# Patient Record
Sex: Female | Born: 1965 | Race: White | Hispanic: No | Smoking: Heavy tobacco smoker
Health system: Southern US, Community
[De-identification: ages and names within clinical notes are randomized; demographics above are authoritative.]

## PROBLEM LIST (undated history)

## (undated) DIAGNOSIS — T8859XA Other complications of anesthesia, initial encounter: Secondary | ICD-10-CM

## (undated) DIAGNOSIS — K219 Gastro-esophageal reflux disease without esophagitis: Secondary | ICD-10-CM

## (undated) DIAGNOSIS — F329 Major depressive disorder, single episode, unspecified: Secondary | ICD-10-CM

## (undated) DIAGNOSIS — T4145XA Adverse effect of unspecified anesthetic, initial encounter: Secondary | ICD-10-CM

## (undated) DIAGNOSIS — F32A Depression, unspecified: Secondary | ICD-10-CM

## (undated) HISTORY — PX: TONSILLECTOMY: SUR1361

## (undated) HISTORY — PX: BUNIONECTOMY: SHX129

## (undated) HISTORY — PX: ABDOMINAL HYSTERECTOMY: SHX81

---

## 2012-07-23 ENCOUNTER — Emergency Department (HOSPITAL_COMMUNITY)
Admission: EM | Admit: 2012-07-23 | Discharge: 2012-07-23 | Disposition: A | Discharge status: Home / Self Care | Payer: Self-pay | Source: Home / Self Care

## 2012-07-23 ENCOUNTER — Encounter (HOSPITAL_COMMUNITY): Payer: Self-pay

## 2012-07-23 DIAGNOSIS — R490 Dysphonia: Secondary | ICD-10-CM

## 2012-07-23 MED ORDER — CEPASTAT 14.5 MG MT LOZG: 1.0000 | LOZENGE | OROMUCOSAL | Status: DC | PRN

## 2012-07-23 MED ORDER — CETIRIZINE HCL 10 MG PO CAPS: 1.0000 | ORAL_CAPSULE | Freq: Every day | ORAL | Status: DC

## 2012-07-23 NOTE — ED Notes (Signed)
Patient states she was sick with cold like symptoms a while back and  Now has had a hoarse voice for couple of months Denies fever

## 2012-07-23 NOTE — ED Notes (Signed)
Patient Demographics  Rachel Whitaker, is a 47 y.o. female  ZOX:[id number]  WJX:[id number]  DOB - [birth year]  Chief Complaint  Patient presents with  . Hoarse        Subjective:   Rachel Whitaker who is a pack-a-day smoker presents with 2 month history of hoarse voice after she developed a URI 2 months ago, denies any dysphagia or odynophagia, no pain or discomfort in the throat, no swollen glands or discharge. Denies any weight loss . No personal or family history of laryngeal cancer.  Objective:   History reviewed. No pertinent past medical history.    Past Surgical History  Procedure Laterality Date  . Abdominal hysterectomy    . Tonsillectomy       Filed Vitals:   07/23/12 1312  BP: 122/78  Pulse: 82  Temp: 98 F (36.7 C)  TempSrc: Oral  SpO2: 98%     Exam  Awake Alert, Oriented X 3, No new F.N deficits, Normal affect Mason.AT,PERRAL Supple Neck,No JVD, No cervical lymphadenopathy appriciated.  Symmetrical Chest wall movement, Good air movement bilaterally, CTAB RRR,No Gallops,Rubs or new Murmurs, No Parasternal Heave +ve B.Sounds, Abd Soft, Non tender, No organomegaly appriciated, No rebound - guarding or rigidity. No Cyanosis, Clubbing or edema, No new Rash or bruise       Data Review   CBC No results found for this basename: WBC, HGB, HCT, PLT, MCV, MCH, MCHC, RDW, NEUTRABS, LYMPHSABS, MONOABS, EOSABS, BASOSABS, BANDABS, BANDSABD,  in the last 168 hours  Chemistries   No results found for this basename: NA, K, CL, CO2, GLUCOSE, BUN, CREATININE, GFRCGP, CALCIUM, MG, AST, ALT, ALKPHOS, BILITOT,  in the last 168 hours ------------------------------------------------------------------------------------------------------------------ No results found for this basename: HGBA1C,  in the last 72 hours ------------------------------------------------------------------------------------------------------------------ No results found for this basename: CHOL, HDL,  LDLCALC, TRIG, CHOLHDL, LDLDIRECT,  in the last 72 hours ------------------------------------------------------------------------------------------------------------------ No results found for this basename: TSH, T4TOTAL, FREET3, T3FREE, THYROIDAB,  in the last 72 hours ------------------------------------------------------------------------------------------------------------------ No results found for this basename: VITAMINB12, FOLATE, FERRITIN, TIBC, IRON, RETICCTPCT,  in the last 72 hours  Coagulation profile  No results found for this basename: INR, PROTIME,  in the last 168 hours     Prior to Admission medications   Medication Sig Start Date End Date Taking? Authorizing Provider  Cetirizine HCl (ZYRTEC ALLERGY) 10 MG CAPS Take 1 capsule (10 mg total) by mouth at bedtime. 07/23/12   Leroy Sea, MD  phenol-menthol (CEPASTAT) 14.5 MG lozenge Take 1 lozenge by mouth every 4 (four) hours as needed for pain. 07/23/12   Leroy Sea, MD     Assessment & Plan   Hoarse voice when on for 2 months and active smoker. Will give her a trial of Zyrtec along with Cepacol, however it is important for her to get a laryngeal exam by ENT. Referral has been made. She is been advised to quit smoking.    Follow-up Information   Follow up with Melvenia Beam, MD. Schedule an appointment as soon as possible for a visit in 1 week. (Please request secondary to make a referral for hoarse voice)    Contact information:   [address] [place] [address] [phone]        Leroy Sea M.D on 07/23/2012 at 1:19 PM   Leroy Sea, MD 07/23/12 1321

## 2012-07-26 NOTE — ED Notes (Signed)
Patient has an appointment 07/26/12 with dr shoemaker @ 9:30 am

## 2012-10-04 ENCOUNTER — Telehealth: Payer: Self-pay | Admitting: Family Medicine

## 2012-10-04 NOTE — Telephone Encounter (Signed)
Pt came in April and was referred to Ear, throat, mouth specialist by Dr Thedore Mins.  Pt did not received orange card until this month and is calling to see status of referral.  Pt made appt for 6/18 but is wondering if referral made by Dr. Thedore Mins is still valid or she needs a new one.

## 2012-10-06 ENCOUNTER — Ambulatory Visit: Payer: No Typology Code available for payment source | Attending: Family Medicine | Admitting: Internal Medicine

## 2012-10-06 VITALS — BP 145/78 | HR 72 | Temp 98.4°F | Resp 16 | Wt 135.8 lb

## 2012-10-06 DIAGNOSIS — J382 Nodules of vocal cords: Secondary | ICD-10-CM

## 2012-10-06 NOTE — Progress Notes (Unsigned)
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

## 2012-10-06 NOTE — Progress Notes (Unsigned)
Patient ID: Rachel Whitaker, female   DOB: 1966/01/25, 47 y.o.   MRN: [id number]   Another referral for ENT has been provided, Patient has not been billed for this office visit

## 2012-10-07 NOTE — Telephone Encounter (Signed)
I spoke to pt yesterday and we are going to sent a new referral to ent .

## 2012-10-13 ENCOUNTER — Encounter (HOSPITAL_COMMUNITY): Payer: Self-pay | Admitting: Pharmacy Technician

## 2012-10-13 NOTE — Pre-Procedure Instructions (Signed)
Rachel Whitaker  10/13/2012   Your procedure is scheduled on: Friday, October 15, 2012  Report to [facility] at  8:30 AM.      Main Entrance to [facility] to 3rd Floor, Short Stay.   Call this number if you have problems the morning of surgery: [phone]    Remember:   Do not eat food or drink liquids after midnight.   Take these medicines the morning of surgery with A SIP OF WATER: omeprazole (PRILOSEC) 40 MG capsule Stop taking Aspirin and herbal medications. Do not take any NSAIDs ie: Ibuprofen, Advil, Naproxen or any medication containing Aspirin.   Do not wear jewelry, make-up or nail polish.  Do not wear lotions, powders, or perfumes. You may wear deodorant.  Do not shave 48 hours prior to surgery.   Do not bring valuables to the hospital.  [facility] is not responsible for any belongings or valuables.  Contacts, dentures or bridgework may not be worn into surgery.  Leave suitcase in the car. After surgery it may be brought to your room.  For patients admitted to the hospital, checkout time is 11:00 AM the day of discharge.   Patients discharged the day of surgery will not be allowed to drive home.  Name and phone number of your driver:-  Special Instructions: Shower with CHG wash ( Bactoshield) tonight and again in the am prior to arriving to hospital.   Please read over the following fact sheets that you were given: Pain Booklet, Coughing and Deep Breathing and Surgical Site Infection Prevention

## 2012-10-14 ENCOUNTER — Encounter (HOSPITAL_COMMUNITY)
Admission: RE | Admit: 2012-10-14 | Discharge: 2012-10-14 | Disposition: A | Discharge status: Home / Self Care | Payer: No Typology Code available for payment source | Source: Ambulatory Visit | Attending: Otolaryngology | Admitting: Otolaryngology

## 2012-10-14 ENCOUNTER — Encounter (HOSPITAL_COMMUNITY): Payer: Self-pay

## 2012-10-14 ENCOUNTER — Other Ambulatory Visit: Payer: Self-pay | Admitting: Otolaryngology

## 2012-10-14 DIAGNOSIS — Z01812 Encounter for preprocedural laboratory examination: Secondary | ICD-10-CM | POA: Insufficient documentation

## 2012-10-14 DIAGNOSIS — Z01818 Encounter for other preprocedural examination: Secondary | ICD-10-CM | POA: Insufficient documentation

## 2012-10-14 HISTORY — DX: Depression, unspecified: F32.A

## 2012-10-14 HISTORY — DX: Major depressive disorder, single episode, unspecified: F32.9

## 2012-10-14 HISTORY — DX: Adverse effect of unspecified anesthetic, initial encounter: T41.45XA

## 2012-10-14 HISTORY — DX: Gastro-esophageal reflux disease without esophagitis: K21.9

## 2012-10-14 HISTORY — DX: Other complications of anesthesia, initial encounter: T88.59XA

## 2012-10-14 LAB — CBC
HCT: 43.3 % (ref 36.0–46.0)
MCV: 95.2 fL (ref 78.0–100.0)
Platelets: 416 10*3/uL — ABNORMAL HIGH (ref 150–400)
RBC: 4.55 MIL/uL (ref 3.87–5.11)
WBC: 7.3 10*3/uL (ref 4.0–10.5)

## 2012-10-14 MED ORDER — CEFAZOLIN SODIUM-DEXTROSE 2-3 GM-% IV SOLR
2.0000 g | INTRAVENOUS | Status: DC
  Filled 2012-10-14 (×2): qty 50

## 2012-10-14 NOTE — Pre-Procedure Instructions (Signed)
Rachel Whitaker  10/14/2012   Your procedure is scheduled on:  June 27th.  Report to [facility] at 8;30 AM. Enter Main Entrance to 3rd Floor, Short Stay.  Call this number if you have problems the morning of surgery: [phone]   Remember:   Do not eat food or drink liquids after midnight.   Take these medicines the morning of surgery with A SIP OF WATER: Protonix.   Do not wear jewelry, make-up or nail polish.  Do not wear lotions, powders, or perfumes. You may wear deodorant.  Do not shave 48 hours prior to surgery.  Do not bring valuables to the hospital.  [facility] is not responsible for any belongings or valuables.  Contacts, dentures or bridgework may not be worn into surgery.  Leave suitcase in the car. After surgery it may be brought to your room.  For patients admitted to the hospital, checkout time is 11:00 AM the day of  discharge.   Patients discharged the day of surgery will not be allowed to drive  home.  Name and phone number of your driver: --  Special Instructions: Shower with CHG wash (Bactoshield) tonight and again in the am prior to arriving to hospital.   Please read over the following fact sheets that you were given: Pain Booklet, Coughing and Deep Breathing and Surgical Site Infection Prevention

## 2012-10-14 NOTE — Pre-Procedure Instructions (Addendum)
Felesia Stahlecker  10/14/2012   Your procedure is scheduled on:  JUne 27th.  Report to [facility] at 8:30AM.  Call this number if you have problems the morning of surgery: [phone]   Remember:   Do not eat food or drink liquids after midnight.   Take these medicines the morning of surgery with A SIP OF WATER: omeprazole (PRILOSEC)    Do not wear jewelry, make-up or nail polish.  Do not wear lotions, powders, or perfumes. You may wear deodorant.  Do not shave 48 hours prior to surgery.   Do not bring valuables to the hospital.  [facility] is not responsible for any belongings or valuables.  Contacts, dentures or bridgework may not be worn into surgery.  Leave suitcase in the car. After surgery it may be brought to your room.  For patients admitted to the hospital, checkout time is 11:00 AM the day of discharge.   Patients discharged the day of surgery will not be allowed to drive home.  Name and phone number of your driver: -   Special Instructions: Shower with CHG wash (Bactoshield) tonight and again in the am prior to arriving to hospital.   Please read over the following fact sheets that you were given: Pain Booklet, Coughing and Deep Breathing and Surgical Site Infection Prevention

## 2012-10-15 ENCOUNTER — Ambulatory Visit (HOSPITAL_COMMUNITY)
Admission: RE | Admit: 2012-10-15 | Discharge: 2012-10-15 | Disposition: A | Discharge status: Home / Self Care | Payer: No Typology Code available for payment source | Source: Ambulatory Visit | Attending: Otolaryngology | Admitting: Otolaryngology

## 2012-10-15 ENCOUNTER — Encounter (HOSPITAL_COMMUNITY): Payer: Self-pay | Admitting: Anesthesiology

## 2012-10-15 ENCOUNTER — Other Ambulatory Visit: Payer: Self-pay | Admitting: Otolaryngology

## 2012-10-15 ENCOUNTER — Encounter (HOSPITAL_COMMUNITY)
Admission: RE | Disposition: A | Discharge status: Home / Self Care | Payer: Self-pay | Source: Ambulatory Visit | Attending: Otolaryngology

## 2012-10-15 ENCOUNTER — Encounter (HOSPITAL_COMMUNITY): Payer: Self-pay | Admitting: *Deleted

## 2012-10-15 DIAGNOSIS — J381 Polyp of vocal cord and larynx: Secondary | ICD-10-CM | POA: Insufficient documentation

## 2012-10-15 DIAGNOSIS — F329 Major depressive disorder, single episode, unspecified: Secondary | ICD-10-CM | POA: Insufficient documentation

## 2012-10-15 DIAGNOSIS — F17200 Nicotine dependence, unspecified, uncomplicated: Secondary | ICD-10-CM | POA: Insufficient documentation

## 2012-10-15 DIAGNOSIS — F3289 Other specified depressive episodes: Secondary | ICD-10-CM | POA: Insufficient documentation

## 2012-10-15 DIAGNOSIS — Z79899 Other long term (current) drug therapy: Secondary | ICD-10-CM | POA: Insufficient documentation

## 2012-10-15 DIAGNOSIS — K219 Gastro-esophageal reflux disease without esophagitis: Secondary | ICD-10-CM | POA: Insufficient documentation

## 2012-10-15 DIAGNOSIS — Z538 Procedure and treatment not carried out for other reasons: Secondary | ICD-10-CM | POA: Insufficient documentation

## 2012-10-15 SURGERY — CANCELLED PROCEDURE
Laterality: Left

## 2012-10-15 MED ORDER — OXYCODONE HCL 5 MG/5ML PO SOLN: 5.0000 mg | Freq: Once | ORAL | Status: DC | PRN

## 2012-10-15 MED ORDER — MORPHINE SULFATE 2 MG/ML IJ SOLN: 1.0000 mg | INTRAMUSCULAR | Status: DC | PRN

## 2012-10-15 MED ORDER — LACTATED RINGERS IV SOLN
INTRAVENOUS | Status: DC
  Administered 2012-10-15: 10:00:00 via INTRAVENOUS

## 2012-10-15 MED ORDER — EPINEPHRINE HCL (NASAL) 0.1 % NA SOLN
NASAL | Status: AC
  Filled 2012-10-15: qty 30

## 2012-10-15 MED ORDER — OXYCODONE HCL 5 MG PO TABS: 5.0000 mg | ORAL_TABLET | Freq: Once | ORAL | Status: DC | PRN

## 2012-10-15 MED ORDER — METOCLOPRAMIDE HCL 5 MG/ML IJ SOLN: 10.0000 mg | Freq: Once | INTRAMUSCULAR | Status: DC | PRN

## 2012-10-15 SURGICAL SUPPLY — 25 items
BALLN PULM 12 13.5 15X75 (BALLOONS)
BALLN PULMONARY 10-12 (MISCELLANEOUS) IMPLANT
BALLOON PULM 12 13.5 15X75 (BALLOONS) IMPLANT
CANISTER SUCTION 2500CC (MISCELLANEOUS) ×2 IMPLANT
CLOTH BEACON ORANGE TIMEOUT ST (SAFETY) ×2 IMPLANT
CONT SPEC 4OZ CLIKSEAL STRL BL (MISCELLANEOUS) IMPLANT
COVER TABLE BACK 60X90 (DRAPES) ×2 IMPLANT
CRADLE DONUT ADULT HEAD (MISCELLANEOUS) IMPLANT
DRAPE PROXIMA HALF (DRAPES) ×2 IMPLANT
GLOVE BIO SURGEON STRL SZ7.5 (GLOVE) ×2 IMPLANT
GOWN STRL NON-REIN LRG LVL3 (GOWN DISPOSABLE) IMPLANT
KIT BASIN OR (CUSTOM PROCEDURE TRAY) ×2 IMPLANT
KIT ROOM TURNOVER OR (KITS) ×2 IMPLANT
MARKER SKIN DUAL TIP RULER LAB (MISCELLANEOUS) IMPLANT
NS IRRIG 1000ML POUR BTL (IV SOLUTION) ×2 IMPLANT
PAD ARMBOARD 7.5X6 YLW CONV (MISCELLANEOUS) ×4 IMPLANT
PAD EYE OVAL STERILE LF (GAUZE/BANDAGES/DRESSINGS) IMPLANT
PATTIES SURGICAL .5 X3 (DISPOSABLE) ×2 IMPLANT
SOLUTION ANTI FOG 6CC (MISCELLANEOUS) IMPLANT
SPONGE GAUZE 4X4 12PLY (GAUZE/BANDAGES/DRESSINGS) ×2 IMPLANT
SURGILUBE 2OZ TUBE FLIPTOP (MISCELLANEOUS) IMPLANT
SUT SILK 2 0 FS (SUTURE) IMPLANT
TOWEL OR 17X24 6PK STRL BLUE (TOWEL DISPOSABLE) ×4 IMPLANT
TUBE CONNECTING 12X1/4 (SUCTIONS) ×2 IMPLANT
WATER STERILE IRR 1000ML POUR (IV SOLUTION) ×2 IMPLANT

## 2012-10-15 NOTE — Anesthesia Preprocedure Evaluation (Deleted)
Anesthesia Evaluation  Patient identified by MRN, date of birth, ID band Patient awake    Reviewed: Allergy & Precautions, H&P , NPO status , Patient's Chart, lab work & pertinent test results, reviewed documented beta blocker date and time   History of Anesthesia Complications (+) Emergence Delirium  Airway Mallampati: II TM Distance: >3 FB Neck ROM: full    Dental   Pulmonary neg pulmonary ROS,  breath sounds clear to auscultation        Cardiovascular negative cardio ROS  Rhythm:regular     Neuro/Psych PSYCHIATRIC DISORDERS negative neurological ROS     GI/Hepatic Neg liver ROS, GERD-  Medicated and Controlled,  Endo/Other  negative endocrine ROS  Renal/GU negative Renal ROS  negative genitourinary   Musculoskeletal   Abdominal   Peds  Hematology negative hematology ROS (+)   Anesthesia Other Findings See surgeon's H&P   Reproductive/Obstetrics negative OB ROS                           Anesthesia Physical Anesthesia Plan  ASA: II  Anesthesia Plan: General   Post-op Pain Management:    Induction: Intravenous  Airway Management Planned: Oral ETT  Additional Equipment:   Intra-op Plan:   Post-operative Plan: Extubation in OR  Informed Consent: I have reviewed the patients History and Physical, chart, labs and discussed the procedure including the risks, benefits and alternatives for the proposed anesthesia with the patient or authorized representative who has indicated his/her understanding and acceptance.   Dental Advisory Given  Plan Discussed with: CRNA and Surgeon  Anesthesia Plan Comments:         Anesthesia Quick Evaluation

## 2012-10-15 NOTE — H&P (Signed)
Rachel Whitaker is an 47 y.o. female.   Chief Complaint: Chronic Hoarseness HPI: Pt with bil VC polyps and chronic hoarseness  Past Medical History  Diagnosis Date  . Complication of anesthesia     "woke up crying"  . GERD (gastroesophageal reflux disease)   . Depression     "about the way her voice sounds"    Past Surgical History  Procedure Laterality Date  . Abdominal hysterectomy    . Tonsillectomy      "grew back"  . Bunionectomy Right     History reviewed. No pertinent family history. Social History:  reports that she has been smoking.  She does not have any smokeless tobacco history on file. She reports that she drinks about 3.0 ounces of alcohol per week. She reports that she does not use illicit drugs.  Allergies: No Known Allergies  Medications Prior to Admission  Medication Sig Dispense Refill  . omeprazole (PRILOSEC) 40 MG capsule Take 40 mg by mouth daily.        Results for orders placed during the hospital encounter of 10/14/12 (from the past 48 hour(s))  CBC     Status: Abnormal   Collection Time    10/14/12  8:59 AM      Result Value Range   WBC 7.3  4.0 - 10.5 K/uL   RBC 4.55  3.87 - 5.11 MIL/uL   Hemoglobin 14.6  12.0 - 15.0 g/dL   HCT 11.9  14.7 - 82.9 %   MCV 95.2  78.0 - 100.0 fL   MCH 32.1  26.0 - 34.0 pg   MCHC 33.7  30.0 - 36.0 g/dL   RDW 56.2  13.0 - 86.5 %   Platelets 416 (*) 150 - 400 K/uL   No results found.  Review of Systems  Constitutional: Negative.   HENT: Negative.   Respiratory: Negative.   Cardiovascular: Negative.   Gastrointestinal: Negative.   Musculoskeletal: Negative.   Skin: Negative.     Blood pressure 126/90, temperature 97.5 F (36.4 C), temperature source Oral, resp. rate 18, SpO2 99.00%. Physical Exam  Constitutional: She is oriented to person, place, and time. She appears well-developed and well-nourished.  HENT:  Bil VC polyps  Neck: Normal range of motion. Neck supple.  Cardiovascular: Normal rate and  regular rhythm.   Respiratory: Effort normal.  GI: Soft.  Musculoskeletal: Normal range of motion.  Neurological: She is alert and oriented to person, place, and time.     Assessment/Plan Adm for Laryngoscopy and debridement of VC polyps.  Cassie Shedlock 10/15/2012, 10:32 AM

## 2012-10-15 NOTE — Preoperative (Signed)
Beta Blockers   Reason not to administer Beta Blockers:Not Applicable 

## 2012-10-26 ENCOUNTER — Encounter (HOSPITAL_COMMUNITY): Payer: Self-pay | Admitting: Emergency Medicine

## 2012-10-26 ENCOUNTER — Emergency Department (HOSPITAL_COMMUNITY)
Admission: EM | Admit: 2012-10-26 | Discharge: 2012-10-26 | Disposition: A | Discharge status: Home / Self Care | Payer: No Typology Code available for payment source | Attending: Emergency Medicine | Admitting: Emergency Medicine

## 2012-10-26 DIAGNOSIS — Z8659 Personal history of other mental and behavioral disorders: Secondary | ICD-10-CM | POA: Insufficient documentation

## 2012-10-26 DIAGNOSIS — K219 Gastro-esophageal reflux disease without esophagitis: Secondary | ICD-10-CM | POA: Insufficient documentation

## 2012-10-26 DIAGNOSIS — F17200 Nicotine dependence, unspecified, uncomplicated: Secondary | ICD-10-CM | POA: Insufficient documentation

## 2012-10-26 DIAGNOSIS — F1010 Alcohol abuse, uncomplicated: Secondary | ICD-10-CM | POA: Insufficient documentation

## 2012-10-26 NOTE — Progress Notes (Signed)
[facility] has seen patient. Patient currently has a [employer]. Pt pcp is at [facility] and [facility].

## 2012-10-26 NOTE — BH Assessment (Signed)
Assessment Note   Rachel Whitaker is an 47 y.o. female presenting to [facility] requesting help for alcohol treatment. Pt states that she has been drinking since she was 12. States that she doesn't think detox is necessary but her aunt has been "antagonizing her" to get her to come today. Pt last drank on Sunday. States that she has been doing well and only drinking on the weekends. States that she drank a 5th over the course of Saturday and Sunday. Has not drank since then. Patient denies that she is having withdrawal symptoms or any discomfort. She denies seizure history. She has a history of blackouts. She has never received inpatient detox. She reports starting treatment at [facility] in the past but did not complete the program. Patient denies SI, HI, and AVH's. Patient has some issues with depression mostly triggered by arguments with her aunt. Writer offered a referral to a residential program but patient declined. She would like to participate in a outpatient program such as IOP. Stating she needs to do a program that will allow her to work her job during the  the evenings.     Axis I: Alcohol Abuse Axis II: Deferred Axis III:  Past Medical History  Diagnosis Date  . Complication of anesthesia     "woke up crying"  . GERD (gastroesophageal reflux disease)   . Depression     "about the way her voice sounds"   Axis IV: other psychosocial or environmental problems and problems with primary support group Axis V: 45  Past Medical History:  Past Medical History  Diagnosis Date  . Complication of anesthesia     "woke up crying"  . GERD (gastroesophageal reflux disease)   . Depression     "about the way her voice sounds"    Past Surgical History  Procedure Laterality Date  . Abdominal hysterectomy    . Tonsillectomy      "grew back"  . Bunionectomy Right     Family History: No family history on file.  Social History:  reports that she has been smoking Cigarettes.  She has a 35 pack-year  smoking history. She does not have any smokeless tobacco history on file. She reports that she drinks about 3.0 ounces of alcohol per week. She reports that she does not use illicit drugs.  Additional Social History:  Alcohol / Drug Use Pain Medications: SEE MAR Prescriptions: Prilosec 40mg 's Over the Counter: SEE MAR History of alcohol / drug use?: Yes Longest period of sobriety (when/how long): 10 yrs  Negative Consequences of Use: Personal relationships;Financial;Work / School Substance #1 Name of Substance 1: Alcohol  1 - Age of First Use: 47 yrs old  1 - Amount (size/oz): Fifth of liqour on the weekends 1 - Frequency: weekends 1 - Duration: on/off for yrs (10 yrs of sobriety) 1 - Last Use / Amount: Sunday  CIWA: CIWA-Ar BP: 139/83 mmHg Pulse Rate: 82 COWS:    Allergies: No Known Allergies  Home Medications:  (Not in a hospital admission)  OB/GYN Status:  No LMP recorded. Patient has had a hysterectomy.  General Assessment Data Location of Assessment: [facility] Living Arrangements: Other (Comment);Other relatives (lives with aunt) Can pt return to current living arrangement?: Yes Admission Status: Voluntary Is patient capable of signing voluntary admission?: Yes Transfer from: Acute Hospital Referral Source: Self/Family/Friend  Education Status Is patient currently in school?: No  Risk to self Suicidal Ideation: No Suicidal Intent: No Is patient at risk for suicide?: No Suicidal Plan?: No  Access to Means: No What has been your use of drugs/alcohol within the last 12 months?:  (n/a) Previous Attempts/Gestures: No How many times?:  (n/a) Other Self Harm Risks:  (n/a) Triggers for Past Attempts: Other (Comment) (no previous attempts/gestures) Intentional Self Injurious Behavior: None Family Suicide History: Yes Recent stressful life event(s): Other (Comment) ("My aunt aggravates me") Persecutory voices/beliefs?: No Depression: Yes Depression Symptoms: Feeling  angry/irritable Substance abuse history and/or treatment for substance abuse?: No Suicide prevention information given to non-admitted patients: Not applicable  Risk to Others Homicidal Ideation: No Thoughts of Harm to Others: No Current Homicidal Intent: No Current Homicidal Plan: No Access to Homicidal Means: No Identified Victim:  (n/a) History of harm to others?: No Assessment of Violence: None Noted Violent Behavior Description:  (Patient is calm and cooperative) Does patient have access to weapons?: No Criminal Charges Pending?: No Does patient have a court date: No  Psychosis Hallucinations: None noted Delusions: None noted  Mental Status Report Appear/Hygiene: Disheveled Eye Contact: Good Motor Activity: Freedom of movement Speech: Logical/coherent Level of Consciousness: Alert Mood: Depressed Affect: Appropriate to circumstance Anxiety Level: None Thought Processes: Coherent;Relevant Judgement: Impaired Orientation: Person;Time;Place;Situation Obsessive Compulsive Thoughts/Behaviors: None  Cognitive Functioning Concentration: Decreased Memory: Recent Intact;Remote Intact IQ: Average Insight: Good Impulse Control: Good Appetite: Good Weight Loss:  (none reported) Weight Gain:  (none reported) Sleep: Decreased Total Hours of Sleep:  (n/a) Vegetative Symptoms: None  ADLScreening [facility] Assessment Services) Patient's cognitive ability adequate to safely complete daily activities?: No Patient able to express need for assistance with ADLs?: No Independently performs ADLs?: No  Abuse/Neglect [facility]) Physical Abuse: Denies Verbal Abuse: Denies Sexual Abuse: Denies  Prior Inpatient Therapy Prior Inpatient Therapy: No Prior Therapy Dates:  (n/a) Prior Therapy Facilty/Provider(s):  (n/a) Reason for Treatment:  (n/a)  Prior Outpatient Therapy Prior Outpatient Therapy: No Prior Therapy Dates:  (n/a) Prior Therapy Facilty/Provider(s):  (n/a) Reason for  Treatment:  (n/a)  ADL Screening (condition at time of admission) Patient's cognitive ability adequate to safely complete daily activities?: No Is the patient deaf or have difficulty hearing?: No Does the patient have difficulty seeing, even when wearing glasses/contacts?: No Does the patient have difficulty concentrating, remembering, or making decisions?: No Patient able to express need for assistance with ADLs?: No Does the patient have difficulty dressing or bathing?: No Independently performs ADLs?: No Communication: Independent Dressing (OT): Independent Grooming: Independent Feeding: Independent Bathing: Independent Toileting: Independent In/Out Bed: Independent Walks in Home: Independent Does the patient have difficulty walking or climbing stairs?: No Weakness of Legs: None Weakness of Arms/Hands: None  Home Assistive Devices/Equipment Home Assistive Devices/Equipment: None    Abuse/Neglect Assessment (Assessment to be complete while patient is alone) Physical Abuse: Denies Verbal Abuse: Denies Sexual Abuse: Denies Exploitation of patient/patient's resources: Denies Self-Neglect: Denies Values / Beliefs Cultural Requests During Hospitalization: None Spiritual Requests During Hospitalization: None   Advance Directives (For Healthcare) Advance Directive: Patient does not have advance directive Nutrition Screen- [facility] Adult/[facility] Patient's home diet: Regular  Additional Information 1:1 In Past 12 Months?: No CIRT Risk: No Elopement Risk: No Does patient have medical clearance?: Yes     Disposition:  Disposition Initial Assessment Completed for this Encounter: Yes Disposition of Patient: Referred to (CD-IOP) Type of inpatient treatment program: Adult Patient referred to: Other (Comment) (CD-IOP)  On Site Evaluation by:   Reviewed with Physician:     Melynda Ripple [facility] 10/26/2012 12:35 PM

## 2012-10-26 NOTE — ED Notes (Signed)
Pt states that she has been drinking since she was 12.  States that she doesn't think detox is necessary but her aunt has been "antagonizing her" to get her to come today.  Pt last drank on Sunday.  States that she has been doing well and only drinking on the weekends.  States that she drank a 5th over the course of Saturday and Sunday.  Has not drank since then.

## 2012-10-26 NOTE — BHH Counselor (Signed)
Patient referred to the [facility] program. Writer left message for Daneil Dolin to inform her that patient is interested in starting th program asap. Writer requesting that Thurston Hole contacts patient with start time information.

## 2012-10-26 NOTE — ED Provider Notes (Signed)
   History    CSN: [id number] Arrival date & time 10/26/12  1017  First MD Initiated Contact with Patient 10/26/12 1052     Chief Complaint  Patient presents with  . Medical Clearance   (Consider location/radiation/quality/duration/timing/severity/associated sxs/prior Treatment) HPI.... chief complaint alcohol abuse.  Patient reports drinking alcohol since age 47.   She has been encouraged to seek help by her aunt.   Last alcohol 2 days ago.   No suicidal or homicidal ideation.  Severity is moderate.  No chest pain, dyspnea, neuro deficits.  No other street drugs. Past Medical History  Diagnosis Date  . Complication of anesthesia     "woke up crying"  . GERD (gastroesophageal reflux disease)   . Depression     "about the way her voice sounds"   Past Surgical History  Procedure Laterality Date  . Abdominal hysterectomy    . Tonsillectomy      "grew back"  . Bunionectomy Right    No family history on file. History  Substance Use Topics  . Smoking status: Heavy Tobacco Smoker -- 1.00 packs/day for 35 years    Types: Cigarettes  . Smokeless tobacco: Not on file  . Alcohol Use: 3.0 oz/week    5 Cans of beer per week   OB History   Grav Para Term Preterm Abortions TAB SAB Ect Mult Living                 Review of Systems  All other systems reviewed and are negative.    Allergies  Review of patient's allergies indicates no known allergies.  Home Medications   Current Outpatient Rx  Name  Route  Sig  Dispense  Refill  . omeprazole (PRILOSEC) 40 MG capsule   Oral   Take 40 mg by mouth daily.          BP 139/83  Pulse 82  Temp(Src) 98.6 F (37 C) (Oral)  Resp 16  SpO2 98% Physical Exam  Nursing note and vitals reviewed. Constitutional: She is oriented to person, place, and time. She appears well-developed and well-nourished.  HENT:  Head: Normocephalic and atraumatic.  Eyes: Conjunctivae and EOM are normal. Pupils are equal, round, and reactive to light.   Neck: Normal range of motion. Neck supple.  Cardiovascular: Normal rate, regular rhythm and normal heart sounds.   Pulmonary/Chest: Effort normal and breath sounds normal.  Abdominal: Soft. Bowel sounds are normal.  Musculoskeletal: Normal range of motion.  Neurological: She is alert and oriented to person, place, and time.  Skin: Skin is warm and dry.  Psychiatric:  Flat affect    ED Course  Procedures (including critical care time) Labs Reviewed - No data to display No results found. 1. Alcohol abuse     MDM  Behavioral health consult obtained. Patient will seek outpatient treatment.  Donnetta Hutching, MD 10/26/12 1220

## 2012-11-08 ENCOUNTER — Ambulatory Visit: Admit: 2012-11-08 | Payer: Self-pay | Admitting: Otolaryngology

## 2012-11-08 SURGERY — MICROLARYNGOSCOPY WITH CO2 LASER AND EXCISION OF VOCAL CORD LESION
Anesthesia: General

## 2012-12-01 NOTE — Addendum Note (Signed)
Addended by: Annalee Genta, Gerry Blanchfield on: 12/01/2012 05:16 PM   Modules accepted: Orders

## 2012-12-03 ENCOUNTER — Ambulatory Visit: Payer: No Typology Code available for payment source | Attending: Family Medicine | Admitting: Internal Medicine

## 2012-12-03 VITALS — BP 137/87 | HR 80 | Temp 98.7°F | Ht 61.5 in | Wt 131.0 lb

## 2012-12-03 DIAGNOSIS — B079 Viral wart, unspecified: Secondary | ICD-10-CM

## 2012-12-03 DIAGNOSIS — R21 Rash and other nonspecific skin eruption: Secondary | ICD-10-CM | POA: Insufficient documentation

## 2012-12-03 MED ORDER — PERMETHRIN 1 % EX LOTN: TOPICAL_LOTION | Freq: Once | CUTANEOUS | Status: DC

## 2012-12-03 NOTE — Progress Notes (Signed)
Patient ID: Rachel Whitaker, female   DOB: 03/14/1966, 47 y.o.   MRN: [id number]   CC:  HPI: 47 year old female who is here for evaluation of skin rash which is distributed on her feet on her shin on her neck and under her breasts. The patient's rash looks like small isolated excoriations with crusting of these lesions. Patient is concerned about scabies/crambs. She states that her boyfriend was recently prescribed a cream for possible crabs.   No Known Allergies Past Medical History  Diagnosis Date  . Complication of anesthesia     "woke up crying"  . GERD (gastroesophageal reflux disease)   . Depression     "about the way her voice sounds"   Current Outpatient Prescriptions on File Prior to Visit  Medication Sig Dispense Refill  . omeprazole (PRILOSEC) 40 MG capsule Take 40 mg by mouth daily.       No current facility-administered medications on file prior to visit.   No family history on file. History   Social History  . Marital Status: Divorced    Spouse Name: N/A    Number of Children: N/A  . Years of Education: N/A   Occupational History  . Not on file.   Social History Main Topics  . Smoking status: Heavy Tobacco Smoker -- 1.00 packs/day for 35 years    Types: Cigarettes  . Smokeless tobacco: Not on file  . Alcohol Use: 3.0 oz/week    5 Cans of beer per week  . Drug Use: No  . Sexual Activity: Not on file   Other Topics Concern  . Not on file   Social History Narrative  . No narrative on file    Review of Systems  Constitutional: Negative for fever, chills, diaphoresis, activity change, appetite change and fatigue.  HENT: Negative for ear pain, nosebleeds, congestion, facial swelling, rhinorrhea, neck pain, neck stiffness and ear discharge.   Eyes: Negative for pain, discharge, redness, itching and visual disturbance.  Respiratory: Negative for cough, choking, chest tightness, shortness of breath, wheezing and stridor.   Cardiovascular: Negative for chest  pain, palpitations and leg swelling.  Gastrointestinal: Negative for abdominal distention.  Genitourinary: Negative for dysuria, urgency, frequency, hematuria, flank pain, decreased urine volume, difficulty urinating and dyspareunia.  Musculoskeletal: Negative for back pain, joint swelling, arthralgias and gait problem.  Neurological: Negative for dizziness, tremors, seizures, syncope, facial asymmetry, speech difficulty, weakness, light-headedness, numbness and headaches.  Hematological: Negative for adenopathy. Does not bruise/bleed easily.  Psychiatric/Behavioral: Negative for hallucinations, behavioral problems, confusion, dysphoric mood, decreased concentration and agitation.    Objective:   Filed Vitals:   12/03/12 1109  BP: 137/87  Pulse: 80  Temp: 98.7 F (37.1 C)    Physical Exam  Constitutional: Appears well-developed and well-nourished. No distress.  HENT: Normocephalic. External right and left ear normal. Oropharynx is clear and moist.  Eyes: Conjunctivae and EOM are normal. PERRLA, no scleral icterus.  Neck: Normal ROM. Neck supple. No JVD. No tracheal deviation. No thyromegaly.  CVS: RRR, S1/S2 +, no murmurs, no gallops, no carotid bruit.  Pulmonary: Effort and breath sounds normal, no stridor, rhonchi, wheezes, rales.  Abdominal: Soft. BS +,  no distension, tenderness, rebound or guarding.  Musculoskeletal: Normal range of motion. No edema and no tenderness.  Lymphadenopathy: No lymphadenopathy noted, cervical, inguinal. Neuro: Alert. Normal reflexes, muscle tone coordination. No cranial nerve deficit. Skin: Skin is warm and dry. No rash noted. Not diaphoretic. No erythema. No pallor.  Psychiatric: Normal mood and affect. Behavior, judgment,  thought content normal.   Lab Results  Component Value Date   WBC 7.3 10/14/2012   HGB 14.6 10/14/2012   HCT 43.3 10/14/2012   MCV 95.2 10/14/2012   PLT 416* 10/14/2012   No results found for this basename: CREATININE, BUN, NA,  K, CL, CO2    No results found for this basename: HGBA1C   Lipid Panel  No results found for this basename: chol, trig, hdl, cholhdl, vldl, ldlcalc       Assessment and plan:   There are no active problems to display for this patient.      Possible scabies Permethrin 1% lotion, apply now and repeat treatment in one week 1 refill provided Followup when necessary

## 2012-12-21 ENCOUNTER — Ambulatory Visit: Payer: No Typology Code available for payment source | Attending: Internal Medicine | Admitting: Internal Medicine

## 2012-12-21 ENCOUNTER — Telehealth: Payer: Self-pay | Admitting: Internal Medicine

## 2012-12-21 ENCOUNTER — Encounter: Payer: Self-pay | Admitting: Internal Medicine

## 2012-12-21 VITALS — BP 126/88 | HR 92 | Temp 99.6°F | Resp 16 | Ht 62.0 in | Wt 128.0 lb

## 2012-12-21 DIAGNOSIS — R21 Rash and other nonspecific skin eruption: Secondary | ICD-10-CM | POA: Insufficient documentation

## 2012-12-21 DIAGNOSIS — L0390 Cellulitis, unspecified: Secondary | ICD-10-CM

## 2012-12-21 DIAGNOSIS — L0291 Cutaneous abscess, unspecified: Secondary | ICD-10-CM

## 2012-12-21 MED ORDER — SULFAMETHOXAZOLE-TMP DS 800-160 MG PO TABS: 1.0000 | ORAL_TABLET | Freq: Two times a day (BID) | ORAL | Status: DC

## 2012-12-21 NOTE — Telephone Encounter (Signed)
Pt came in today to see if she could get her script changed to septra (tablets); pt says discomfort is spreading up to her eyes; pt is waiting in lobby

## 2012-12-21 NOTE — Patient Instructions (Signed)
Cellulitis Cellulitis is an infection of the skin and the tissue beneath it. The infected area is usually red and tender. Cellulitis occurs most often in the arms and lower legs.  CAUSES  Cellulitis is caused by bacteria that enter the skin through cracks or cuts in the skin. The most common types of bacteria that cause cellulitis are Staphylococcus and Streptococcus. SYMPTOMS   Redness and warmth.  Swelling.  Tenderness or pain.  Fever. DIAGNOSIS  Your caregiver can usually determine what is wrong based on a physical exam. Blood tests may also be done. TREATMENT  Treatment usually involves taking an antibiotic medicine. HOME CARE INSTRUCTIONS   Take your antibiotics as directed. Finish them even if you start to feel better.  Keep the infected arm or leg elevated to reduce swelling.  Apply a warm cloth to the affected area up to 4 times per day to relieve pain.  Only take over-the-counter or prescription medicines for pain, discomfort, or fever as directed by your caregiver.  Keep all follow-up appointments as directed by your caregiver. SEEK MEDICAL CARE IF:   You notice red streaks coming from the infected area.  Your red area gets larger or turns dark in color.  Your bone or joint underneath the infected area becomes painful after the skin has healed.  Your infection returns in the same area or another area.  You notice a swollen bump in the infected area.  You develop new symptoms. SEEK IMMEDIATE MEDICAL CARE IF:   You have a fever.  You feel very sleepy.  You develop vomiting or diarrhea.  You have a general ill feeling (malaise) with muscle aches and pains. MAKE SURE YOU:   Understand these instructions.  Will watch your condition.  Will get help right away if you are not doing well or get worse. Document Released: 01/15/2005 Document Revised: 10/07/2011 Document Reviewed: 06/23/2011 [employer] Patient Information 2014 [employer].  

## 2012-12-21 NOTE — Progress Notes (Signed)
Patient ID: Rachel Whitaker, female   DOB: 08-Apr-1966, 47 y.o.   MRN: [id number]  CC: Rash  HPI: 47 year old female with no significant past medical history who presented to clinic with complaints of generalized rash, maculopapular mostly in lower extremities but also scattered on the back. There are certain areas on legs with erythema. Patient reports no fevers or chills. She [place] and reports her significant other had a similar type of rash which has resolved with 5 day course of Bactrim. No reports of nausea or vomiting or abdominal pain. No chest pains or shortness of breath.  No Known Allergies Past Medical History  Diagnosis Date  . Complication of anesthesia     "woke up crying"  . GERD (gastroesophageal reflux disease)   . Depression     "about the way her voice sounds"   Current Outpatient Prescriptions on File Prior to Visit  Medication Sig Dispense Refill  . omeprazole (PRILOSEC) 40 MG capsule Take 40 mg by mouth daily.      . permethrin (ELIMITE) 1 % lotion Apply topically once. Shampoo, rinse and towel dry hair, saturate hair and scalp with permethrin. Rinse after 10 min; repeat in 1 week if needed  120 mL  1   No current facility-administered medications on file prior to visit.   Family medical history significant for HTN, HLD  History   Social History  . Marital Status: Divorced    Spouse Name: N/A    Number of Children: N/A  . Years of Education: N/A   Occupational History  . Not on file.   Social History Main Topics  . Smoking status: Heavy Tobacco Smoker -- 1.00 packs/day for 35 years    Types: Cigarettes  . Smokeless tobacco: Not on file  . Alcohol Use: 3.0 oz/week    5 Cans of beer per week  . Drug Use: No  . Sexual Activity: Not on file   Other Topics Concern  . Not on file   Social History Narrative  . No narrative on file    Review of Systems  Constitutional: Negative for fever, chills, diaphoresis, activity change, appetite  change and fatigue.  HENT: Negative for ear pain, nosebleeds, congestion, facial swelling, rhinorrhea, neck pain, neck stiffness and ear discharge.   Eyes: Negative for pain, discharge, redness, itching and visual disturbance.  Respiratory: Negative for cough, choking, chest tightness, shortness of breath, wheezing and stridor.   Cardiovascular: Negative for chest pain, palpitations and leg swelling.  Gastrointestinal: Negative for abdominal distention.  Genitourinary: Negative for dysuria, urgency, frequency, hematuria, flank pain, decreased urine volume, difficulty urinating and dyspareunia.  Musculoskeletal: Negative for back pain, joint swelling, arthralgias and gait problem.  Neurological: Negative for dizziness, tremors, seizures, syncope, facial asymmetry, speech difficulty, weakness, light-headedness, numbness and headaches.  Hematological: Negative for adenopathy. Does not bruise/bleed easily.  Psychiatric/Behavioral: Negative for hallucinations, behavioral problems, confusion, dysphoric mood, decreased concentration and agitation.    Objective:   Filed Vitals:   12/21/12 1113  BP: 126/88  Pulse: 92  Temp: 99.6 F (37.6 C)  Resp: 16    Physical Exam  Constitutional: Appears well-developed and well-nourished. No distress.  HENT: Normocephalic. External right and left ear normal. Oropharynx is clear and moist.  Eyes: Conjunctivae and EOM are normal. PERRLA, no scleral icterus.  Neck: Normal ROM. Neck supple. No JVD. No tracheal deviation. No thyromegaly.  CVS: RRR, S1/S2 +, no murmurs, no gallops, no carotid bruit.  Pulmonary: Effort and breath sounds normal,  no stridor, rhonchi, wheezes, rales.  Abdominal: Soft. BS +,  no distension, tenderness, rebound or guarding.  Musculoskeletal: Normal range of motion. No edema and no tenderness.  Lymphadenopathy: No lymphadenopathy noted, cervical, inguinal. Neuro: Alert. Normal reflexes, muscle tone coordination. No cranial nerve  deficit. Skin: Maculopapular rash, generalized but mostly on lower extremities bilaterally scattered.  Psychiatric: Normal mood and affect. Behavior, judgment, thought content normal.   Lab Results  Component Value Date   WBC 7.3 10/14/2012   HGB 14.6 10/14/2012   HCT 43.3 10/14/2012   MCV 95.2 10/14/2012   PLT 416* 10/14/2012   No results found for this basename: CREATININE, BUN, NA, K, CL, CO2    No results found for this basename: HGBA1C   Lipid Panel  No results found for this basename: chol, trig, hdl, cholhdl, vldl, ldlcalc       Assessment and plan:   Patient Active Problem List   Diagnosis Date Noted  . Cellulitis 12/21/2012    Priority: High - with maculopapular rash generalzied but mostly on bilateral lower extremities - start septra for 5 days

## 2012-12-21 NOTE — Progress Notes (Signed)
Pt here to f/u on rash 2 weeks ago prescribed Permethrin lotion to treat scabies but pt states family member had same sx and was told bacteril infection. Continues sx itchiness all over

## 2013-02-16 ENCOUNTER — Emergency Department (INDEPENDENT_AMBULATORY_CARE_PROVIDER_SITE_OTHER): Payer: PRIVATE HEALTH INSURANCE

## 2013-02-16 ENCOUNTER — Encounter (HOSPITAL_COMMUNITY): Payer: Self-pay | Admitting: Emergency Medicine

## 2013-02-16 ENCOUNTER — Emergency Department (INDEPENDENT_AMBULATORY_CARE_PROVIDER_SITE_OTHER)
Admission: EM | Admit: 2013-02-16 | Discharge: 2013-02-16 | Disposition: A | Discharge status: Home / Self Care | Payer: PRIVATE HEALTH INSURANCE | Source: Home / Self Care

## 2013-02-16 DIAGNOSIS — S8002XA Contusion of left knee, initial encounter: Secondary | ICD-10-CM

## 2013-02-16 DIAGNOSIS — S8000XA Contusion of unspecified knee, initial encounter: Secondary | ICD-10-CM

## 2013-02-16 DIAGNOSIS — IMO0002 Reserved for concepts with insufficient information to code with codable children: Secondary | ICD-10-CM

## 2013-02-16 DIAGNOSIS — S86912A Strain of unspecified muscle(s) and tendon(s) at lower leg level, left leg, initial encounter: Secondary | ICD-10-CM

## 2013-02-16 MED ORDER — NAPROXEN 500 MG PO TBEC: 500.0000 mg | DELAYED_RELEASE_TABLET | Freq: Two times a day (BID) | ORAL | Status: DC

## 2013-02-16 NOTE — ED Provider Notes (Signed)
CSN: [id number]     Arrival date & time 02/16/13  1043 History   First MD Initiated Contact with Patient 02/16/13 1233     Chief Complaint  Patient presents with  . Knee Injury   (Consider location/radiation/quality/duration/timing/severity/associated sxs/prior Treatment) HPI Comments: 47 year old female who appears older than her stated age is complaining of left knee pain after falling last p.m. She states that she does not know exactly where her knee hurts but it only hurts with weightbearing. When sitting there is no pain. Prolonged palpation reveals pain to the anteromedial aspect. Denies other injuries. 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   Past Medical History  Diagnosis Date  . Complication of anesthesia     "woke up crying"  . GERD (gastroesophageal reflux disease)   . Depression     "about the way her voice sounds"   Past Surgical History  Procedure Laterality Date  . Abdominal hysterectomy    . Tonsillectomy      "grew back"  . Bunionectomy Right    No family history on file. History  Substance Use Topics  . Smoking status: Heavy Tobacco Smoker -- 1.00 packs/day for 35 years    Types: Cigarettes  . Smokeless tobacco: Not on file  . Alcohol Use: 3.0 oz/week    5 Cans of beer per week   OB History   Grav Para Term Preterm Abortions TAB SAB Ect Mult Living                 Review of Systems  Constitutional: Negative for fever, chills and activity change.  HENT: Negative.   Respiratory: Negative.   Cardiovascular: Negative.   Musculoskeletal: Positive for arthralgias.       As per HPI  Skin: Negative for color change, pallor and rash.  Neurological: Negative.     Allergies  Review of patient's allergies indicates no known allergies.  Home Medications   Current Outpatient Rx  Name  Route  Sig  Dispense  Refill  . naproxen (EC-NAPROSYN) 500 MG EC tablet   Oral  Take 1 tablet (500 mg total) by mouth 2 (two) times daily with a meal. As needed for knee pain   15 tablet   0   . omeprazole (PRILOSEC) 40 MG capsule   Oral   Take 40 mg by mouth daily.         . permethrin (ELIMITE) 1 % lotion   Topical   Apply topically once. Shampoo, rinse and towel dry hair, saturate hair and scalp with permethrin. Rinse after 10 min; repeat in 1 week if needed   120 mL   1   . sulfamethoxazole-trimethoprim (BACTRIM DS) 800-160 MG per tablet   Oral   Take 1 tablet by mouth 2 (two) times daily.   10 tablet   0    BP 107/77  Pulse 104  Temp(Src) 97.8 F (36.6 C) (Oral)  Resp 20  SpO2 100% Physical Exam  Nursing note and vitals reviewed. Constitutional: She is oriented to person, place, and time. She appears well-developed and well-nourished. No distress.  HENT:  Head: Normocephalic and atraumatic.  Eyes: EOM are normal. Pupils are equal, round, and reactive to light.  Neck: Normal range of motion. Neck supple.  Cardiovascular: Normal rate.   Pulmonary/Chest: Effort normal. No respiratory distress.  Musculoskeletal: Normal range of motion. She exhibits tenderness. She exhibits no  edema.  After much palpation there is tenderness to the anteromedial aspect of the knee. There is no swelling and minimal purplish discoloration. Extension and flexion is complete. No pain with portion medial or lateral. Negative varus and valgus. Negative drawer. No laxity appreciated. When standing and asked to ambulate she walks with a limp.  Lymphadenopathy:    She has no cervical adenopathy.  Neurological: She is alert and oriented to person, place, and time. No cranial nerve deficit.  Skin: Skin is warm and dry.    ED Course  Procedures (including critical care time) Labs Review Labs Reviewed - No data to display Imaging Review Dg Knee Complete 4 Views Left  02/16/2013   CLINICAL DATA:  Tripped and fell  EXAM: LEFT KNEE - COMPLETE 4+ VIEW  COMPARISON:  None.   FINDINGS: There is no evidence of fracture, dislocation, or joint effusion. There is no evidence of arthropathy or other focal bone abnormality. Soft tissues are unremarkable.  IMPRESSION: Negative.   Electronically Signed   By: Signa Kell M.D.   On: 02/16/2013 13:00      MDM   1. Knee contusion, left, initial encounter   2. Knee strain, left, initial encounter      Knee immobilizer when up and walking for 5-7 days. Naprosyn ec 500 bid with food prn RICE    Hayden Rasmussen, NP 02/16/13 1314

## 2013-02-16 NOTE — ED Notes (Addendum)
Pt c/o left knee inj onset yest night Reports she fell and tripped onto gravel flooring... Pain increases w/activity Multiple abrasions to the knee Denies: swelling,  Ambulated well to exam room w/a limp Alert w/no signs of acute distress.

## 2013-02-16 NOTE — ED Provider Notes (Signed)
Medical screening examination/treatment/procedure(s) were performed by non-physician practitioner and as supervising physician I was immediately available for consultation/collaboration.  Leslee Home, M.D.  Reuben Likes, MD 02/16/13 [phone]

## 2013-02-21 ENCOUNTER — Ambulatory Visit: Payer: PRIVATE HEALTH INSURANCE | Attending: Internal Medicine | Admitting: Internal Medicine

## 2013-02-21 ENCOUNTER — Encounter: Payer: Self-pay | Admitting: Internal Medicine

## 2013-02-21 ENCOUNTER — Other Ambulatory Visit (HOSPITAL_COMMUNITY)
Admission: RE | Admit: 2013-02-21 | Discharge: 2013-02-21 | Disposition: A | Discharge status: Home / Self Care | Payer: PRIVATE HEALTH INSURANCE | Source: Ambulatory Visit | Attending: Internal Medicine | Admitting: Internal Medicine

## 2013-02-21 VITALS — BP 122/85 | HR 68 | Temp 98.7°F | Resp 17

## 2013-02-21 DIAGNOSIS — B079 Viral wart, unspecified: Secondary | ICD-10-CM | POA: Insufficient documentation

## 2013-02-21 DIAGNOSIS — A630 Anogenital (venereal) warts: Secondary | ICD-10-CM

## 2013-02-21 DIAGNOSIS — N760 Acute vaginitis: Secondary | ICD-10-CM | POA: Insufficient documentation

## 2013-02-21 DIAGNOSIS — Z113 Encounter for screening for infections with a predominantly sexual mode of transmission: Secondary | ICD-10-CM | POA: Insufficient documentation

## 2013-02-21 DIAGNOSIS — N898 Other specified noninflammatory disorders of vagina: Secondary | ICD-10-CM

## 2013-02-21 NOTE — Progress Notes (Signed)
Patient ID: Rachel Whitaker, female   DOB: Aug 27, 1965, 47 y.o.   MRN: [id number] Patient Demographics  Rachel Whitaker, is a 47 y.o. female  NFA:[id number]  ION:[id number]  DOB - 1965/08/21  Chief Complaint  Patient presents with  . Genital Warts        Subjective:   Rachel Whitaker is a 47 y.o. female here today for a follow up visit. Patient walked in today complaining of possible genital warts. She noticed that few days ago while taking a shower. She wants this checked out and possibly referred to a gynecologist. No fever, no abdominal pain.  Patient has No headache, No chest pain, No abdominal pain - No Nausea, No new weakness tingling or numbness, No Cough - SOB.  ALLERGIES: No Known Allergies  PAST MEDICAL HISTORY: Past Medical History  Diagnosis Date  . Complication of anesthesia     "woke up crying"  . GERD (gastroesophageal reflux disease)   . Depression     "about the way her voice sounds"    MEDICATIONS AT HOME: Prior to Admission medications   Medication Sig Start Date End Date Taking? Authorizing Provider  naproxen (EC-NAPROSYN) 500 MG EC tablet Take 1 tablet (500 mg total) by mouth 2 (two) times daily with a meal. As needed for knee pain 02/16/13   Hayden Rasmussen, NP  omeprazole (PRILOSEC) 40 MG capsule Take 40 mg by mouth daily.    Historical Provider, MD  permethrin (ELIMITE) 1 % lotion Apply topically once. Shampoo, rinse and towel dry hair, saturate hair and scalp with permethrin. Rinse after 10 min; repeat in 1 week if needed 12/03/12   [name], MD  sulfamethoxazole-trimethoprim (BACTRIM DS) 800-160 MG per tablet Take 1 tablet by mouth 2 (two) times daily. 12/21/12   [name], MD     Objective:   Filed Vitals:   02/21/13 1100  BP: 122/85  Pulse: 68  Temp: 98.7 F (37.1 C)  Resp: 17  SpO2: 100%    Exam General appearance : Awake, alert, not in any distress. Speech Clear. Not toxic looking HEENT: Atraumatic and Normocephalic, pupils equally  reactive to light and accomodation Neck: supple, no JVD. No cervical lymphadenopathy.  Chest:Good air entry bilaterally, no added sounds  CVS: S1 S2 regular, no murmurs.  Abdomen: Bowel sounds present, Non tender and not distended with no gaurding, rigidity or rebound. Extremities: B/L Lower Ext shows no edema, both legs are warm to touch Neurology: Awake alert, and oriented X 3, CN II-XII intact, Non focal Skin:No Rash Wounds:N/A Vaginal examination: 2 fleshy growth on the vulva, not typical of Wart, copious discharge in the introitus, samples sent for tests  Data Review   CBC No results found for this basename: WBC, HGB, HCT, PLT, MCV, MCH, MCHC, RDW, NEUTRABS, LYMPHSABS, MONOABS, EOSABS, BASOSABS, BANDABS, BANDSABD,  in the last 168 hours  Chemistries   No results found for this basename: NA, K, CL, CO2, GLUCOSE, BUN, CREATININE, GFRCGP, CALCIUM, MG, AST, ALT, ALKPHOS, BILITOT,  in the last 168 hours ------------------------------------------------------------------------------------------------------------------ No results found for this basename: HGBA1C,  in the last 72 hours ------------------------------------------------------------------------------------------------------------------ No results found for this basename: CHOL, HDL, LDLCALC, TRIG, CHOLHDL, LDLDIRECT,  in the last 72 hours ------------------------------------------------------------------------------------------------------------------ No results found for this basename: TSH, T4TOTAL, FREET3, T3FREE, THYROIDAB,  in the last 72 hours ------------------------------------------------------------------------------------------------------------------ No results found for this basename: VITAMINB12, FOLATE, FERRITIN, TIBC, IRON, RETICCTPCT,  in the last 72 hours  Coagulation profile  No results found for this basename: INR, PROTIME,  in the last 168 hours    Assessment & Plan   Patient Active Problem List    Diagnosis Date Noted  . Vaginal discharge 02/21/2013  . Genital warts 02/21/2013  . Cellulitis 12/21/2012     Plan: Ambulatory referral to gynecologist  Vagina swab sent for wet mount and culture  Patient has been extensively counseled about smoking cessation Patient also counseled about nutrition and exercise  Follow up in 3 months or when necessary   The patient was given clear instructions to go to ER or return to medical center if symptoms don't improve, worsen or new problems develop. The patient verbalized understanding. The patient was told to call to get lab results if they haven't heard anything in the next week.    Jeanann Lewandowsky, MD, MHA, FACP, FAAP [facility] [place], [place] [phone]   02/21/2013, 11:36 AM

## 2013-02-21 NOTE — Progress Notes (Signed)
Patient is here for "genital warts" States she noticed them on Saturday And has been researching them on the computer She is not quite sure what they are

## 2013-02-23 LAB — HERPES SIMPLEX VIRUS(HSV) DNA BY PCR: HSV 1 DNA: NOT DETECTED

## 2013-02-24 ENCOUNTER — Telehealth: Payer: Self-pay

## 2013-02-25 NOTE — Telephone Encounter (Signed)
Message copied by Darlis Loan on Fri Feb 25, 2013  2:04 PM ------      Message from: Quentin Angst      Created: Fri Feb 25, 2013 12:16 PM       Please let patient know about her vagina swab is negative for herpes ------

## 2013-02-25 NOTE — Telephone Encounter (Signed)
Pt given negative results.

## 2013-03-04 ENCOUNTER — Ambulatory Visit: Payer: PRIVATE HEALTH INSURANCE | Attending: Internal Medicine | Admitting: Internal Medicine

## 2013-03-04 VITALS — BP 124/88 | HR 87 | Temp 98.0°F | Resp 16 | Wt 128.6 lb

## 2013-03-04 DIAGNOSIS — F329 Major depressive disorder, single episode, unspecified: Secondary | ICD-10-CM

## 2013-03-04 MED ORDER — DULOXETINE HCL 30 MG PO CPEP: 30.0000 mg | ORAL_CAPSULE | Freq: Every day | ORAL | Status: DC

## 2013-03-04 NOTE — Progress Notes (Unsigned)
Patient states she has been depressed Financial issues Problem with her throat Has no interest in anything anymore Used to take cymbalta Denies any suicidal thoughts

## 2013-03-04 NOTE — Patient Instructions (Signed)
Drowsiness and somnolence other primary side effects of Cymbalta Please discuss all side effects with your pharmacist

## 2013-03-04 NOTE — Progress Notes (Unsigned)
Patient ID: Rachel Whitaker, female   DOB: 1966-03-04, 47 y.o.   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 She's had a history since age 41 of chronic depression and has tried several different antidepressants including Cymbalta, Lexapro, She is requesting Cymbalta today, she has been off of it for about a year and a half She denies any weight loss, loss of appetite but complains of excessive sleeping She has called into work several times because of her depression She is a smoker one pack a day, she does not exercise She denies any abusive relationships  She complains of a strong family history of alcoholism and depression, and both her daughters and her parents  She has never been hospitalized for psychiatric reasons  She denies any suicidal or homicidal ideation   No Known Allergies Past Medical History  Diagnosis Date  . Complication of anesthesia     "woke up crying"  . GERD (gastroesophageal reflux disease)   . Depression     "about the way her voice sounds"   Current Outpatient Prescriptions on File Prior to Visit  Medication Sig Dispense Refill  . naproxen (EC-NAPROSYN) 500 MG EC tablet Take 1 tablet (500 mg total) by mouth 2 (two) times daily with a meal. As needed for knee pain  15 tablet  0  . omeprazole (PRILOSEC) 40 MG capsule Take 40 mg by mouth daily.      . permethrin (ELIMITE) 1 % lotion Apply topically once. Shampoo, rinse and towel dry hair, saturate hair and scalp with permethrin. Rinse after 10 min; repeat in 1 week if needed  120 mL  1  . sulfamethoxazole-trimethoprim (BACTRIM DS) 800-160 MG per tablet Take 1 tablet by mouth 2 (two) times daily.  10 tablet  0   No current facility-administered medications on file prior to visit.   History reviewed. No pertinent family history. History   Social History  . Marital Status: Divorced    Spouse Name: N/A    Number of Children: N/A  . Years of Education: N/A    Occupational History  . Not on file.   Social History Main Topics  . Smoking status: Heavy Tobacco Smoker -- 1.00 packs/day for 35 years    Types: Cigarettes  . Smokeless tobacco: Not on file  . Alcohol Use: 3.0 oz/week    5 Cans of beer per week  . Drug Use: No  . Sexual Activity: Not on file   Other Topics Concern  . Not on file   Social History Narrative  . No narrative on file    Review of Systems  Constitutional: Negative for fever, chills, diaphoresis, activity change, appetite change and fatigue.  HENT: Negative for ear pain, nosebleeds, congestion, facial swelling, rhinorrhea, neck pain, neck stiffness and ear discharge.   Eyes: Negative for pain, discharge, redness, itching and visual disturbance.  Respiratory: Negative for cough, choking, chest tightness, shortness of breath, wheezing and stridor.   Cardiovascular: Negative for chest pain, palpitations and leg swelling.  Gastrointestinal: Negative for abdominal distention.  Genitourinary: Negative for dysuria, urgency, frequency, hematuria, flank pain, decreased urine volume, difficulty urinating and dyspareunia.  Musculoskeletal: Negative for back pain, joint swelling, arthralgias and gait problem.  Neurological: Negative for dizziness, tremors, seizures, syncope, facial asymmetry, speech difficulty, weakness, light-headedness, numbness and headaches.  Hematological: Negative for adenopathy. Does not bruise/bleed easily.  Psychiatric/Behavioral: Negative for hallucinations, behavioral problems, confusion, dysphoric mood, decreased concentration and agitation.  Objective:   Filed Vitals:   03/04/13 1410  BP: 124/88  Pulse: 87  Temp: 98 F (36.7 C)  Resp: 16    Physical Exam  Constitutional: Appears well-developed and well-nourished. No distress.  HENT: Normocephalic. External right and left ear normal. Oropharynx is clear and moist.  Eyes: Conjunctivae and EOM are normal. PERRLA, no scleral icterus.   Neck: Normal ROM. Neck supple. No JVD. No tracheal deviation. No thyromegaly.  CVS: RRR, S1/S2 +, no murmurs, no gallops, no carotid bruit.  Pulmonary: Effort and breath sounds normal, no stridor, rhonchi, wheezes, rales.  Abdominal: Soft. BS +,  no distension, tenderness, rebound or guarding.  Musculoskeletal: Normal range of motion. No edema and no tenderness.  Lymphadenopathy: No lymphadenopathy noted, cervical, inguinal. Neuro: Alert. Normal reflexes, muscle tone coordination. No cranial nerve deficit. Skin: Skin is warm and dry. No rash noted. Not diaphoretic. No erythema. No pallor.  Psychiatric: Normal mood and affect. Behavior, judgment, thought content normal.   Lab Results  Component Value Date   WBC 7.3 10/14/2012   HGB 14.6 10/14/2012   HCT 43.3 10/14/2012   MCV 95.2 10/14/2012   PLT 416* 10/14/2012   No results found for this basename: CREATININE, BUN, NA, K, CL, CO2    No results found for this basename: HGBA1C   Lipid Panel  No results found for this basename: chol, trig, hdl, cholhdl, vldl, ldlcalc       Assessment and plan:   Patient Active Problem List   Diagnosis Date Noted  . Vaginal discharge 02/21/2013  . Genital warts 02/21/2013  . Cellulitis 12/21/2012       Depression We'll start the patient on Cymbalta 30 mg a day Psychiatrist referral has been provided Side effects discussed Encouraged to establish care within one month    The patient was given clear instructions to go to ER or return to medical center if symptoms don't improve, worsen or new problems develop. The patient verbalized understanding. The patient was told to call to get any lab results if not heard anything in the next week.

## 2013-03-05 ENCOUNTER — Telehealth: Payer: Self-pay | Admitting: Emergency Medicine

## 2013-03-05 LAB — VITAMIN D 25 HYDROXY (VIT D DEFICIENCY, FRACTURES): Vit D, 25-Hydroxy: 32 ng/mL (ref 30–89)

## 2013-03-05 MED ORDER — METRONIDAZOLE 500 MG PO TABS: 2000.0000 mg | ORAL_TABLET | Freq: Once | ORAL | Status: DC

## 2013-03-05 NOTE — Progress Notes (Signed)
LVM- pt to pick Flagyl 2 g from [facility] pharmacy

## 2013-03-05 NOTE — Telephone Encounter (Signed)
Message copied by Darlis Loan on Sat Mar 05, 2013  9:43 AM ------      Message from: Quentin Angst      Created: Fri Mar 04, 2013  4:44 PM       Please let patient know that her Pap smear is negative for malignancy. Her vagina swab however is positive for bacterial vaginosis. Please call in a prescription for Flagyl 500 mg tablet, 4 tablets by mouth once, total of 2 g ------

## 2013-03-05 NOTE — Addendum Note (Signed)
Addended by: Nonnie Done D on: 03/05/2013 09:49 AM   Modules accepted: Orders, Medications

## 2013-03-14 ENCOUNTER — Encounter: Payer: Self-pay | Admitting: Obstetrics and Gynecology

## 2013-03-22 ENCOUNTER — Ambulatory Visit: Payer: Self-pay

## 2013-04-06 ENCOUNTER — Ambulatory Visit: Payer: Self-pay

## 2013-04-18 ENCOUNTER — Telehealth: Payer: Self-pay

## 2013-04-25 ENCOUNTER — Encounter: Payer: Self-pay | Admitting: Obstetrics and Gynecology

## 2013-06-01 ENCOUNTER — Encounter (HOSPITAL_COMMUNITY): Payer: Self-pay | Admitting: Pharmacy Technician

## 2013-06-02 ENCOUNTER — Other Ambulatory Visit: Payer: Self-pay | Admitting: Otolaryngology

## 2013-06-02 ENCOUNTER — Encounter (HOSPITAL_COMMUNITY)
Admission: RE | Admit: 2013-06-02 | Discharge: 2013-06-02 | Disposition: A | Discharge status: Home / Self Care | Payer: No Typology Code available for payment source | Source: Ambulatory Visit | Attending: Otolaryngology | Admitting: Otolaryngology

## 2013-06-02 ENCOUNTER — Encounter (HOSPITAL_COMMUNITY): Payer: Self-pay

## 2013-06-02 DIAGNOSIS — Z01812 Encounter for preprocedural laboratory examination: Secondary | ICD-10-CM | POA: Insufficient documentation

## 2013-06-02 LAB — COMPREHENSIVE METABOLIC PANEL
ALT: 28 U/L (ref 0–35)
AST: 18 U/L (ref 0–37)
Albumin: 3.7 g/dL (ref 3.5–5.2)
Alkaline Phosphatase: 76 U/L (ref 39–117)
BUN: 14 mg/dL (ref 6–23)
CO2: 27 meq/L (ref 19–32)
Calcium: 8.9 mg/dL (ref 8.4–10.5)
Chloride: 104 mEq/L (ref 96–112)
Creatinine, Ser: 0.63 mg/dL (ref 0.50–1.10)
Glucose, Bld: 104 mg/dL — ABNORMAL HIGH (ref 70–99)
Potassium: 4.8 mEq/L (ref 3.7–5.3)
SODIUM: 144 meq/L (ref 137–147)
Total Bilirubin: <0.2 mg/dL — ABNORMAL LOW (ref 0.3–1.2)
Total Protein: 7.1 g/dL (ref 6.0–8.3)

## 2013-06-02 LAB — CBC
HCT: 39.7 % (ref 36.0–46.0)
HEMOGLOBIN: 13.8 g/dL (ref 12.0–15.0)
MCH: 33.9 pg (ref 26.0–34.0)
MCHC: 34.8 g/dL (ref 30.0–36.0)
MCV: 97.5 fL (ref 78.0–100.0)
Platelets: 477 10*3/uL — ABNORMAL HIGH (ref 150–400)
RBC: 4.07 MIL/uL (ref 3.87–5.11)
RDW: 13.3 % (ref 11.5–15.5)
WBC: 10.1 10*3/uL (ref 4.0–10.5)

## 2013-06-02 NOTE — Pre-Procedure Instructions (Signed)
Rachel Whitaker  06/02/2013   Your procedure is scheduled on:  Feb 13 at 0900  Report to [facility]  2 * 3 at 0700AM.  Call this number if you have problems the morning of surgery: [phone]   Remember:   Do not eat food or drink liquids after midnight.   Take these medicines the morning of surgery with A SIP OF WATER: na  Stop taking aspirin, aleve, ibuprofen, BC's, Goody's, Fish Oil, and Herbal medications   Do not wear jewelry, make-up or nail polish.  Do not wear lotions, powders, or perfumes. You may wear deodorant.  Do not shave 48 hours prior to surgery. Men may shave face and neck.  Do not bring valuables to the hospital.  [facility] is not responsible                  for any belongings or valuables.               Contacts, dentures or bridgework may not be worn into surgery.  Leave suitcase in the car. After surgery it may be brought to your room.  For patients admitted to the hospital, discharge time is determined by your                treatment team.               Patients discharged the day of surgery will not be allowed to drive  home.    Special Instructions: Shower using CHG 2 nights before surgery and the night before surgery.  If you shower the day of surgery use CHG.  Use special wash - you have one bottle of CHG for all showers.  You should use approximately 1/3 of the bottle for each shower.   Please read over the following fact sheets that you were given: Pain Booklet, Coughing and Deep Breathing and Surgical Site Infection Prevention

## 2013-06-02 NOTE — Progress Notes (Addendum)
Attempted to call pt at work- not there today Attempted to call at home- after several attempts states that she is here now, she thought the time was 1330.

## 2013-06-02 NOTE — Progress Notes (Signed)
Pt unsure of who her PCP is Denies having a recent CXR or EKG Denies seeing a cardiologist. Denies having a stress test, echo., Or card cath.

## 2013-06-02 NOTE — Progress Notes (Signed)
Dr. Victorio Palm office called, spoke with [facility]. Informed office of no orders.

## 2013-06-03 ENCOUNTER — Encounter (HOSPITAL_COMMUNITY): Payer: No Typology Code available for payment source | Admitting: Anesthesiology

## 2013-06-03 ENCOUNTER — Encounter (HOSPITAL_COMMUNITY)
Admission: RE | Disposition: A | Discharge status: Home / Self Care | Payer: Self-pay | Source: Ambulatory Visit | Attending: Otolaryngology

## 2013-06-03 ENCOUNTER — Ambulatory Visit (HOSPITAL_COMMUNITY)
Admission: RE | Admit: 2013-06-03 | Discharge: 2013-06-03 | Disposition: A | Discharge status: Home / Self Care | Payer: No Typology Code available for payment source | Source: Ambulatory Visit | Attending: Otolaryngology | Admitting: Otolaryngology

## 2013-06-03 ENCOUNTER — Ambulatory Visit (HOSPITAL_COMMUNITY): Payer: No Typology Code available for payment source | Admitting: Anesthesiology

## 2013-06-03 ENCOUNTER — Encounter (HOSPITAL_COMMUNITY): Payer: Self-pay | Admitting: Anesthesiology

## 2013-06-03 DIAGNOSIS — D141 Benign neoplasm of larynx: Secondary | ICD-10-CM | POA: Insufficient documentation

## 2013-06-03 DIAGNOSIS — K219 Gastro-esophageal reflux disease without esophagitis: Secondary | ICD-10-CM | POA: Insufficient documentation

## 2013-06-03 DIAGNOSIS — J384 Edema of larynx: Secondary | ICD-10-CM | POA: Diagnosis present

## 2013-06-03 DIAGNOSIS — F329 Major depressive disorder, single episode, unspecified: Secondary | ICD-10-CM | POA: Insufficient documentation

## 2013-06-03 DIAGNOSIS — F3289 Other specified depressive episodes: Secondary | ICD-10-CM | POA: Insufficient documentation

## 2013-06-03 DIAGNOSIS — F17200 Nicotine dependence, unspecified, uncomplicated: Secondary | ICD-10-CM | POA: Insufficient documentation

## 2013-06-03 DIAGNOSIS — R490 Dysphonia: Secondary | ICD-10-CM | POA: Insufficient documentation

## 2013-06-03 HISTORY — PX: MICROLARYNGOSCOPY WITH LASER: SHX5972

## 2013-06-03 SURGERY — MICROLARYNGOSCOPY, WITH PROCEDURE USING LASER
Anesthesia: General

## 2013-06-03 MED ORDER — HYDROMORPHONE HCL PF 1 MG/ML IJ SOLN
INTRAMUSCULAR | Status: AC
  Administered 2013-06-03: 0.5 mg via INTRAVENOUS
  Filled 2013-06-03: qty 1

## 2013-06-03 MED ORDER — ONDANSETRON HCL 4 MG/2ML IJ SOLN
INTRAMUSCULAR | Status: DC | PRN
  Administered 2013-06-03: 4 mg via INTRAVENOUS

## 2013-06-03 MED ORDER — FENTANYL CITRATE 0.05 MG/ML IJ SOLN
INTRAMUSCULAR | Status: DC | PRN
  Administered 2013-06-03: 25 ug via INTRAVENOUS
  Administered 2013-06-03: 100 ug via INTRAVENOUS
  Administered 2013-06-03: 25 ug via INTRAVENOUS

## 2013-06-03 MED ORDER — ROCURONIUM BROMIDE 50 MG/5ML IV SOLN
INTRAVENOUS | Status: AC
  Filled 2013-06-03: qty 1

## 2013-06-03 MED ORDER — HYDROMORPHONE HCL PF 1 MG/ML IJ SOLN: 0.2500 mg | INTRAMUSCULAR | Status: DC | PRN

## 2013-06-03 MED ORDER — GLYCOPYRROLATE 0.2 MG/ML IJ SOLN
INTRAMUSCULAR | Status: AC
  Filled 2013-06-03: qty 2

## 2013-06-03 MED ORDER — HYDROMORPHONE HCL PF 1 MG/ML IJ SOLN
0.2500 mg | INTRAMUSCULAR | Status: DC | PRN
  Administered 2013-06-03 (×2): 0.5 mg via INTRAVENOUS

## 2013-06-03 MED ORDER — PROPOFOL 10 MG/ML IV BOLUS
INTRAVENOUS | Status: AC
  Filled 2013-06-03: qty 20

## 2013-06-03 MED ORDER — ONDANSETRON HCL 4 MG/2ML IJ SOLN
INTRAMUSCULAR | Status: AC
  Filled 2013-06-03: qty 2

## 2013-06-03 MED ORDER — MIDAZOLAM HCL 5 MG/5ML IJ SOLN
INTRAMUSCULAR | Status: DC | PRN
  Administered 2013-06-03: 2 mg via INTRAVENOUS

## 2013-06-03 MED ORDER — NEOSTIGMINE METHYLSULFATE 1 MG/ML IJ SOLN
INTRAMUSCULAR | Status: AC
Start: 2013-06-03 — End: 2013-06-03
  Filled 2013-06-03: qty 10

## 2013-06-03 MED ORDER — 0.9 % SODIUM CHLORIDE (POUR BTL) OPTIME
TOPICAL | Status: DC | PRN
  Administered 2013-06-03: 1000 mL

## 2013-06-03 MED ORDER — ARTIFICIAL TEARS OP OINT
TOPICAL_OINTMENT | OPHTHALMIC | Status: AC
  Filled 2013-06-03: qty 3.5

## 2013-06-03 MED ORDER — NEOSTIGMINE METHYLSULFATE 1 MG/ML IJ SOLN
INTRAMUSCULAR | Status: AC
  Filled 2013-06-03: qty 10

## 2013-06-03 MED ORDER — PROPOFOL 10 MG/ML IV BOLUS
INTRAVENOUS | Status: DC | PRN
  Administered 2013-06-03: 200 mg via INTRAVENOUS

## 2013-06-03 MED ORDER — FENTANYL CITRATE 0.05 MG/ML IJ SOLN
INTRAMUSCULAR | Status: AC
  Filled 2013-06-03: qty 5

## 2013-06-03 MED ORDER — LIDOCAINE HCL (CARDIAC) 20 MG/ML IV SOLN
INTRAVENOUS | Status: DC | PRN
  Administered 2013-06-03: 40 mg via INTRAVENOUS

## 2013-06-03 MED ORDER — LACTATED RINGERS IV SOLN
INTRAVENOUS | Status: DC | PRN
  Administered 2013-06-03 (×2): via INTRAVENOUS

## 2013-06-03 MED ORDER — ROCURONIUM BROMIDE 100 MG/10ML IV SOLN: INTRAVENOUS | Status: DC | PRN

## 2013-06-03 MED ORDER — MIDAZOLAM HCL 2 MG/2ML IJ SOLN
INTRAMUSCULAR | Status: AC
  Filled 2013-06-03: qty 2

## 2013-06-03 MED ORDER — EPINEPHRINE HCL (NASAL) 0.1 % NA SOLN
NASAL | Status: AC
  Filled 2013-06-03: qty 30

## 2013-06-03 MED ORDER — SUCCINYLCHOLINE CHLORIDE 20 MG/ML IJ SOLN
INTRAMUSCULAR | Status: DC | PRN
  Administered 2013-06-03: 160 mg via INTRAVENOUS

## 2013-06-03 MED ORDER — LACTATED RINGERS IV SOLN
INTRAVENOUS | Status: DC
  Administered 2013-06-03: 09:00:00 via INTRAVENOUS

## 2013-06-03 MED ORDER — EPINEPHRINE HCL 1 MG/ML IJ SOLN
INTRAMUSCULAR | Status: DC | PRN
  Administered 2013-06-03: 1 mg

## 2013-06-03 MED ORDER — LIDOCAINE HCL (CARDIAC) 20 MG/ML IV SOLN
INTRAVENOUS | Status: AC
  Filled 2013-06-03: qty 5

## 2013-06-03 SURGICAL SUPPLY — 35 items
BALLN PULM 12 13.5 15X75 (BALLOONS)
BALLN PULM 12 13.5 15X75CM (BALLOONS)
BALLN PULMONARY 10 12MM (MISCELLANEOUS)
BALLN PULMONARY 10-12 (MISCELLANEOUS) IMPLANT
BALLOON PULM 12 13.5 15X75 (BALLOONS) IMPLANT
BLADE SKIER 22 2.9 M4 (BLADE) ×2 IMPLANT
BLADE SKIMMER 22CM 2.9MM M4 (BLADE) ×1
CANISTER SUCTION 2500CC (MISCELLANEOUS) ×6 IMPLANT
CLOTH BEACON ORANGE TIMEOUT ST (SAFETY) IMPLANT
CONT SPEC 4OZ CLIKSEAL STRL BL (MISCELLANEOUS) ×3 IMPLANT
COVER MAYO STAND STRL (DRAPES) ×3 IMPLANT
COVER TABLE BACK 60X90 (DRAPES) ×3 IMPLANT
CRADLE DONUT ADULT HEAD (MISCELLANEOUS) IMPLANT
DRAPE PROXIMA HALF (DRAPES) ×3 IMPLANT
GLOVE BIO SURGEON STRL SZ7.5 (GLOVE) ×3 IMPLANT
GLOVE BIOGEL PI IND STRL 7.0 (GLOVE) ×1 IMPLANT
GLOVE BIOGEL PI INDICATOR 7.0 (GLOVE) ×2
GLOVE SURG SS PI 6.5 STRL IVOR (GLOVE) ×3 IMPLANT
GLOVE SURG SS PI 7.0 STRL IVOR (GLOVE) ×3 IMPLANT
GOWN STRL NON-REIN LRG LVL3 (GOWN DISPOSABLE) ×9 IMPLANT
KIT BASIN OR (CUSTOM PROCEDURE TRAY) ×3 IMPLANT
KIT ROOM TURNOVER OR (KITS) ×3 IMPLANT
MARKER SKIN DUAL TIP RULER LAB (MISCELLANEOUS) IMPLANT
NS IRRIG 1000ML POUR BTL (IV SOLUTION) ×3 IMPLANT
PAD ARMBOARD 7.5X6 YLW CONV (MISCELLANEOUS) ×3 IMPLANT
PAD EYE OVAL STERILE LF (GAUZE/BANDAGES/DRESSINGS) IMPLANT
PATTIES SURGICAL .5 X3 (DISPOSABLE) ×3 IMPLANT
SOLUTION ANTI FOG 6CC (MISCELLANEOUS) ×3 IMPLANT
SPONGE GAUZE 4X4 12PLY (GAUZE/BANDAGES/DRESSINGS) ×3 IMPLANT
SURGILUBE 2OZ TUBE FLIPTOP (MISCELLANEOUS) IMPLANT
SUT SILK 2 0 FS (SUTURE) IMPLANT
TOWEL OR 17X24 6PK STRL BLUE (TOWEL DISPOSABLE) ×6 IMPLANT
TUBE CONNECTING 12'X1/4 (SUCTIONS) ×1
TUBE CONNECTING 12X1/4 (SUCTIONS) ×2 IMPLANT
WATER STERILE IRR 1000ML POUR (IV SOLUTION) IMPLANT

## 2013-06-03 NOTE — H&P (Signed)
Rachel Whitaker is an 47 y.o. female.   Chief Complaint: Chronic hoarseness HPI: Chronic hoarseness and smoking, sig VC edema and poor voice  Past Medical History  Diagnosis Date  . Complication of anesthesia     "woke up crying"  . GERD (gastroesophageal reflux disease)   . Depression     "about the way her voice sounds"    Past Surgical History  Procedure Laterality Date  . Abdominal hysterectomy    . Tonsillectomy      "grew back"  . Bunionectomy Right     History reviewed. No pertinent family history. Social History:  reports that she has been smoking Cigarettes.  She has a 35 pack-year smoking history. She does not have any smokeless tobacco history on file. She reports that she does not drink alcohol or use illicit drugs.  Allergies: Not on File  Medications Prior to Admission  Medication Sig Dispense Refill  . B Complex-C (B-COMPLEX WITH VITAMIN C) tablet Take 1 tablet by mouth daily.      . Multiple Vitamin (MULTIVITAMIN WITH MINERALS) TABS tablet Take 1 tablet by mouth daily.      . Omega-3 Fatty Acids (FISH OIL) 1000 MG CAPS Take 1,000 mg by mouth daily.        Results for orders placed during the hospital encounter of 06/02/13 (from the past 48 hour(s))  COMPREHENSIVE METABOLIC PANEL     Status: Abnormal   Collection Time    06/02/13  1:48 PM      Result Value Ref Range   Sodium 144  137 - 147 mEq/L   Potassium 4.8  3.7 - 5.3 mEq/L   Chloride 104  96 - 112 mEq/L   CO2 27  19 - 32 mEq/L   Glucose, Bld 104 (*) 70 - 99 mg/dL   BUN 14  6 - 23 mg/dL   Creatinine, Ser 0.63  0.50 - 1.10 mg/dL   Calcium 8.9  8.4 - 10.5 mg/dL   Total Protein 7.1  6.0 - 8.3 g/dL   Albumin 3.7  3.5 - 5.2 g/dL   AST 18  0 - 37 U/L   ALT 28  0 - 35 U/L   Alkaline Phosphatase 76  39 - 117 U/L   Total Bilirubin <0.2 (*) 0.3 - 1.2 mg/dL   GFR calc non Af Amer >90  >90 mL/min   GFR calc Af Amer >90  >90 mL/min   Comment: (NOTE)     The eGFR has been calculated using the CKD EPI  equation.     This calculation has not been validated in all clinical situations.     eGFR's persistently <90 mL/min signify possible Chronic Kidney     Disease.  CBC     Status: Abnormal   Collection Time    06/02/13  1:48 PM      Result Value Ref Range   WBC 10.1  4.0 - 10.5 K/uL   RBC 4.07  3.87 - 5.11 MIL/uL   Hemoglobin 13.8  12.0 - 15.0 g/dL   HCT 39.7  36.0 - 46.0 %   MCV 97.5  78.0 - 100.0 fL   MCH 33.9  26.0 - 34.0 pg   MCHC 34.8  30.0 - 36.0 g/dL   RDW 13.3  11.5 - 15.5 %   Platelets 477 (*) 150 - 400 K/uL   No results found.  Review of Systems  Constitutional: Negative.   HENT: Negative.   Respiratory: Negative.   Cardiovascular: Negative.  Gastrointestinal: Negative.   Musculoskeletal: Negative.     Blood pressure 109/65, pulse 86, temperature 98.1 F (36.7 C), temperature source Oral, resp. rate 18, SpO2 99.00%. Physical Exam  Constitutional: She is oriented to person, place, and time. She appears well-developed and well-nourished.  Neck: Normal range of motion. Neck supple.  Cardiovascular: Normal rate and regular rhythm.   Respiratory: Effort normal.  GI: Soft.  Musculoskeletal: Normal range of motion.  Neurological: She is alert and oriented to person, place, and time.     Assessment/Plan Adm for OP laryngoscopy and excision of VC edema.  Valmy, Kaleth Koy 06/03/2013, 8:56 AM

## 2013-06-03 NOTE — Preoperative (Signed)
Beta Blockers   Reason not to administer Beta Blockers:Not Applicable 

## 2013-06-03 NOTE — Addendum Note (Signed)
Addended by: Jerrell Belfast on: 06/03/2013 08:52 AM   Modules accepted: Orders

## 2013-06-03 NOTE — Discharge Instructions (Signed)
General Anesthesia, Adult, Care After  °Refer to this sheet in the next few weeks. These instructions provide you with information on caring for yourself after your procedure. Your health care provider may also give you more specific instructions. Your treatment has been planned according to current medical practices, but problems sometimes occur. Call your health care provider if you have any problems or questions after your procedure.  °WHAT TO EXPECT AFTER THE PROCEDURE  °After the procedure, it is typical to experience:  °Sleepiness.  °Nausea and vomiting. °HOME CARE INSTRUCTIONS  °For the first 24 hours after general anesthesia:  °Have a responsible person with you.  °Do not drive a car. If you are alone, do not take public transportation.  °Do not drink alcohol.  °Do not take medicine that has not been prescribed by your health care provider.  °Do not sign important papers or make important decisions.  °You may resume a normal diet and activities as directed by your health care provider.  °Change bandages (dressings) as directed.  °If you have questions or problems that seem related to general anesthesia, call the hospital and ask for the anesthetist or anesthesiologist on call. °SEEK MEDICAL CARE IF:  °You have nausea and vomiting that continue the day after anesthesia.  °You develop a rash. °SEEK IMMEDIATE MEDICAL CARE IF:  °You have difficulty breathing.  °You have chest pain.  °You have any allergic problems. °Document Released: 07/14/2000 Document Revised: 12/08/2012 Document Reviewed: 10/21/2012  °Exit[employer]® Patient Information ©2014 [employer].  ° °What to eat: ° °For your first meals, you should eat lightly; only small meals initially.  If you do not have nausea, you may eat larger meals.  Avoid spicy, greasy and heavy food.   ° °

## 2013-06-03 NOTE — Anesthesia Postprocedure Evaluation (Signed)
  Anesthesia Post-op Note  Patient: Rachel Whitaker  Procedure(s) Performed: Procedure(s): MICROLARYNGOSCOPY WITH  EXCISION OF VOCAL CORD EDEMA (N/A)  Patient Location: PACU  Anesthesia Type:General  Level of Consciousness: awake  Airway and Oxygen Therapy: Patient Spontanous Breathing  Post-op Pain: mild  Post-op Assessment: Post-op Vital signs reviewed  Post-op Vital Signs: Reviewed  Complications: No apparent anesthesia complications

## 2013-06-03 NOTE — Transfer of Care (Signed)
Immediate Anesthesia Transfer of Care Note  Patient: Rachel Whitaker  Procedure(s) Performed: Procedure(s): MICROLARYNGOSCOPY WITH  EXCISION OF VOCAL CORD EDEMA (N/A)  Patient Location: PACU  Anesthesia Type:General  Level of Consciousness: awake, alert  and oriented  Airway & Oxygen Therapy: Patient Spontanous Breathing and Patient connected to nasal cannula oxygen  Post-op Assessment: Report given to PACU RN and Post -op Vital signs reviewed and stable  Post vital signs: Reviewed and stable  Complications: No apparent anesthesia complications

## 2013-06-03 NOTE — Anesthesia Preprocedure Evaluation (Addendum)
Anesthesia Evaluation  Patient identified by MRN, date of birth, ID band Patient awake    Reviewed: Allergy & Precautions, H&P , NPO status , Patient's Chart, lab work & pertinent test results, reviewed documented beta blocker date and time   Airway Mallampati: II TM Distance: >3 FB Neck ROM: Full    Dental  (+) Teeth Intact, Loose,    Pulmonary Current Smoker,  breath sounds clear to auscultation  Pulmonary exam normal       Cardiovascular Rhythm:Regular Rate:Normal     Neuro/Psych Depression    GI/Hepatic GERD-  ,  Endo/Other    Renal/GU      Musculoskeletal   Abdominal Normal abdominal exam  (+)   Peds  Hematology   Anesthesia Other Findings Hoarse raspy voice.  Reproductive/Obstetrics                          Anesthesia Physical Anesthesia Plan  ASA: II  Anesthesia Plan: General   Post-op Pain Management:    Induction: Intravenous  Airway Management Planned: Oral ETT  Additional Equipment:   Intra-op Plan:   Post-operative Plan: Extubation in OR  Informed Consent: I have reviewed the patients History and Physical, chart, labs and discussed the procedure including the risks, benefits and alternatives for the proposed anesthesia with the patient or authorized representative who has indicated his/her understanding and acceptance.   Dental advisory given  Plan Discussed with: CRNA, Anesthesiologist and Surgeon  Anesthesia Plan Comments:         Anesthesia Quick Evaluation

## 2013-06-03 NOTE — Brief Op Note (Signed)
06/03/2013  11:58 AM  PATIENT:  Rachel Whitaker  48 y.o. female  PRE-OPERATIVE DIAGNOSIS:  vocal cord polyp  POST-OPERATIVE DIAGNOSIS:  vocal cord polyp  PROCEDURE:  Procedure(s): MICROLARYNGOSCOPY WITH  EXCISION OF VOCAL CORD EDEMA (N/A)  SURGEON:  Surgeon(s) and Role:    * Jerrell Belfast, MD - Primary  PHYSICIAN ASSISTANT:   ASSISTANTS: none   ANESTHESIA:   general  EBL:  Total I/O In: 1000 [I.V.:1000] Out: - None  BLOOD ADMINISTERED:none  DRAINS: none   LOCAL MEDICATIONS USED:  NONE  SPECIMEN:  Source of Specimen:  Left vocal cord mucosa  DISPOSITION OF SPECIMEN:  PATHOLOGY  COUNTS:  YES  TOURNIQUET:  * No tourniquets in log *  DICTATION: .Other Dictation: Dictation Number [id number]  PLAN OF CARE: Discharge to home after PACU  PATIENT DISPOSITION:  PACU - hemodynamically stable.   Delay start of Pharmacological VTE agent (>24hrs) due to surgical blood loss or risk of bleeding: not applicable

## 2013-06-04 NOTE — Op Note (Signed)
Rachel Whitaker, Rachel Whitaker             ACCOUNT NO.:  [id number]  MEDICAL RECORD NO.:  [id number]  LOCATION:  [facility]                         FACILITY:  [facility]  PHYSICIAN:  Early Chars. Wilburn Cornelia, M.D.DATE OF BIRTH:  1965/06/13  DATE OF PROCEDURE:  06/03/2013 DATE OF DISCHARGE:  06/03/2013                              OPERATIVE REPORT   PREOPERATIVE DIAGNOSES: 1. Chronic vocal cord edema. 2. Chronic hoarseness.  POSTOPERATIVE DIAGNOSES: 1. Chronic vocal cord edema. 2. Chronic hoarseness.  INDICATION FOR SURGERY: 1. Chronic vocal cord edema. 2. Chronic hoarseness.  SURGICAL PROCEDURE:  Microlaryngoscopy with microdebrider biopsy of laryngeal vocal cord mucosal edema.  SURGEON:  Early Chars. Wilburn Cornelia, MD  ANESTHESIA:  General endotracheal.  COMPLICATIONS:  No complications.  BLOOD LOSS:  Minimal.  The patient transferred to the operating room to the recovery room in stable condition.  BRIEF HISTORY:  The patient is a 48 year old, white female who is referred to our office for evaluation of chronic hoarseness and vocal cord dysfunction.  She is a longstanding smoker and has noted increasing symptoms of raspy voice, poor vocal quality, and frequent cough. Examination in the office showed significant left vocal cord, mucosal edema with swelling, and partial obstruction of the airway.  Given the patient's history, examination, and findings, I recommended to undertake microlaryngoscopy with microdebrider excision of laryngeal vocal cord mucosal edema.  The risks and benefits of procedure were discussed in detail and the patient understood and concurred with our plan for surgery which is scheduled on elective basis on June 03, 2013.  DESCRIPTION OF PROCEDURE:  The patient was brought to the operating room, and placed in supine position on the operating table.  General endotracheal anesthesia was established without difficulty.  When the patient was adequately anesthetized, she was  positioned on the operating table, and prepped and draped in a sterile fashion.  A Dedo laryngoscope was then inserted without difficulty.  Tooth guard was applied and there were no loose or broken teeth at the beginning of the surgical procedure.  Examination of the upper airway, including base of tongue and supraglottis was undertaken.  There was no evidence of mucosal mass, lesion, ulcer, or tumor with laryngoscope inserted full length and adequate visualization of the laryngeal structures, microsuspension was then placed, and the operating microscope was moved into position. Examination of the vocal cords showed significant soft tissue submucosal edema (Reinke's space edema).  No evidence of mucosal ulcer, mass, or lesion.  Using laryngeal microdebrider, the abnormal polypoid mucosa was then resected in its entirety.  Care taken not to extend below the submucosal layer and the vocal cord itself was not traumatized and was intact in its entirety.  Further examination of the larynx showed no evidence of additional lesion or mass and an adrenaline soaked pledget was then placed over the left true vocal cord after allowing approximately 2 minutes for vasoconstriction hemostasis, the pledget was removed.  There was no evidence of active bleeding.  Larynx was in good condition.  The patient was then awakened from anesthetic.  The Dedo laryngoscope and microsuspension device was removed.  The patient's right upper incisor was loose, but not dislodged and was repositioned without difficulty.  The patient was then  awakened from anesthetic, extubated, and transferred from the operating room to the recovery room in stable condition.  No complications.  The blood loss was minimal.          ______________________________ Early Chars. Wilburn Cornelia, M.D.     DLS/MEDQ  D:  [id number]  T:  06/04/2013  Job:  [id number]

## 2013-06-07 ENCOUNTER — Encounter (HOSPITAL_COMMUNITY): Payer: Self-pay | Admitting: Otolaryngology

## 2013-10-31 ENCOUNTER — Emergency Department (INDEPENDENT_AMBULATORY_CARE_PROVIDER_SITE_OTHER): Payer: No Typology Code available for payment source

## 2013-10-31 ENCOUNTER — Emergency Department (INDEPENDENT_AMBULATORY_CARE_PROVIDER_SITE_OTHER)
Admission: EM | Admit: 2013-10-31 | Discharge: 2013-10-31 | Disposition: A | Discharge status: Home / Self Care | Payer: No Typology Code available for payment source | Source: Home / Self Care | Attending: Emergency Medicine | Admitting: Emergency Medicine

## 2013-10-31 ENCOUNTER — Encounter (HOSPITAL_COMMUNITY): Payer: Self-pay | Admitting: Emergency Medicine

## 2013-10-31 DIAGNOSIS — T6391XA Toxic effect of contact with unspecified venomous animal, accidental (unintentional), initial encounter: Secondary | ICD-10-CM

## 2013-10-31 DIAGNOSIS — T63461A Toxic effect of venom of wasps, accidental (unintentional), initial encounter: Secondary | ICD-10-CM

## 2013-10-31 DIAGNOSIS — J069 Acute upper respiratory infection, unspecified: Secondary | ICD-10-CM

## 2013-10-31 MED ORDER — HYDROXYZINE HCL 25 MG PO TABS: 25.0000 mg | ORAL_TABLET | Freq: Three times a day (TID) | ORAL | Status: AC | PRN

## 2013-10-31 NOTE — Discharge Instructions (Signed)
Bee, Wasp, or Hornet Sting Your caregiver has diagnosed you as having an insect sting. An insect sting appears as a red lump in the skin that sometimes has a tiny hole in the center, or it may have a stinger in the center of the wound. The most common stings are from wasps, hornets and bees. Individuals have different reactions to insect stings.  A normal reaction may cause pain, swelling, and redness around the sting site.  A localized allergic reaction may cause swelling and redness that extends beyond the sting site.  A large local reaction may continue to develop over the next 12 to 36 hours.  On occasion, the reactions can be severe (anaphylactic reaction). An anaphylactic reaction may cause wheezing; difficulty breathing; chest pain; fainting; raised, itchy, red patches on the skin; a sick feeling to your stomach (nausea); vomiting; cramping; or diarrhea. If you have had an anaphylactic reaction to an insect sting in the past, you are more likely to have one again. HOME CARE INSTRUCTIONS   With bee stings, a small sac of poison is left in the wound. Brushing across this with something such as a credit card, or anything similar, will help remove this and decrease the amount of the reaction. This same procedure will not help a wasp sting as they do not leave behind a stinger and poison sac.  Apply a cold compress for 10 to 20 minutes every hour for 1 to 2 days, depending on severity, to reduce swelling and itching.  To lessen pain, a paste made of water and baking soda may be rubbed on the bite or sting and left on for 5 minutes.  To relieve itching and swelling, you may use take medication or apply medicated creams or lotions as directed.  Only take over-the-counter or prescription medicines for pain, discomfort, or fever as directed by your caregiver.  Wash the sting site daily with soap and water. Apply antibiotic ointment on the sting site as directed.  If you suffered a severe  reaction:  If you did not require hospitalization, an adult will need to stay with you for 24 hours in case the symptoms return.  You may need to wear a medical bracelet or necklace stating the allergy.  You and your family need to learn when and how to use an anaphylaxis kit or epinephrine injection.  If you have had a severe reaction before, always carry your anaphylaxis kit with you. SEEK MEDICAL CARE IF:   None of the above helps within 2 to 3 days.  The area becomes red, warm, tender, and swollen beyond the area of the bite or sting.  You have an oral temperature above 102 F (38.9 C). SEEK IMMEDIATE MEDICAL CARE IF:  You have symptoms of an allergic reaction which are:  Wheezing.  Difficulty breathing.  Chest pain.  Lightheadedness or fainting.  Itchy, raised, red patches on the skin.  Nausea, vomiting, cramping or diarrhea. ANY OF THESE SYMPTOMS MAY REPRESENT A SERIOUS PROBLEM THAT IS AN EMERGENCY. Do not wait to see if the symptoms will go away. Get medical help right away. Call your local emergency services (911 in [place] DO NOT drive yourself to the hospital. MAKE SURE YOU:   Understand these instructions.  Will watch your condition.  Will get help right away if you are not doing well or get worse. Document Released: 04/07/2005 Document Revised: 06/30/2011 Document Reviewed: 09/22/2009 [facility] Patient Information 2015 [place], [place]. This information is not intended to replace advice given  to you by your health care provider. Make sure you discuss any questions you have with your health care provider.  Upper Respiratory Infection, Adult An upper respiratory infection (URI) is also sometimes known as the common cold. The upper respiratory tract includes the nose, sinuses, throat, trachea, and bronchi. Bronchi are the airways leading to the lungs. Most people improve within 1 week, but symptoms can last up to 2 weeks. A residual cough may last even longer.    CAUSES Many different viruses can infect the tissues lining the upper respiratory tract. The tissues become irritated and inflamed and often become very moist. Mucus production is also common. A cold is contagious. You can easily spread the virus to others by oral contact. This includes kissing, sharing a glass, coughing, or sneezing. Touching your mouth or nose and then touching a surface, which is then touched by another person, can also spread the virus. SYMPTOMS  Symptoms typically develop 1 to 3 days after you come in contact with a cold virus. Symptoms vary from person to person. They may include:  Runny nose.  Sneezing.  Nasal congestion.  Sinus irritation.  Sore throat.  Loss of voice (laryngitis).  Cough.  Fatigue.  Muscle aches.  Loss of appetite.  Headache.  Low-grade fever. DIAGNOSIS  You might diagnose your own cold based on familiar symptoms, since most people get a cold 2 to 3 times a year. Your caregiver can confirm this based on your exam. Most importantly, your caregiver can check that your symptoms are not due to another disease such as strep throat, sinusitis, pneumonia, asthma, or epiglottitis. Blood tests, throat tests, and X-rays are not necessary to diagnose a common cold, but they may sometimes be helpful in excluding other more serious diseases. Your caregiver will decide if any further tests are required. RISKS AND COMPLICATIONS  You may be at risk for a more severe case of the common cold if you smoke cigarettes, have chronic heart disease (such as heart failure) or lung disease (such as asthma), or if you have a weakened immune system. The very young and very old are also at risk for more serious infections. Bacterial sinusitis, middle ear infections, and bacterial pneumonia can complicate the common cold. The common cold can worsen asthma and chronic obstructive pulmonary disease (COPD). Sometimes, these complications can require emergency medical care  and may be life-threatening. PREVENTION  The best way to protect against getting a cold is to practice good hygiene. Avoid oral or hand contact with people with cold symptoms. Wash your hands often if contact occurs. There is no clear evidence that vitamin C, vitamin E, echinacea, or exercise reduces the chance of developing a cold. However, it is always recommended to get plenty of rest and practice good nutrition. TREATMENT  Treatment is directed at relieving symptoms. There is no cure. Antibiotics are not effective, because the infection is caused by a virus, not by bacteria. Treatment may include:  Increased fluid intake. Sports drinks offer valuable electrolytes, sugars, and fluids.  Breathing heated mist or steam (vaporizer or shower).  Eating chicken soup or other clear broths, and maintaining good nutrition.  Getting plenty of rest.  Using gargles or lozenges for comfort.  Controlling fevers with ibuprofen or acetaminophen as directed by your caregiver.  Increasing usage of your inhaler if you have asthma. Zinc gel and zinc lozenges, taken in the first 24 hours of the common cold, can shorten the duration and lessen the severity of symptoms. Pain medicines may  help with fever, muscle aches, and throat pain. A variety of non-prescription medicines are available to treat congestion and runny nose. Your caregiver can make recommendations and may suggest nasal or lung inhalers for other symptoms.  HOME CARE INSTRUCTIONS   Only take over-the-counter or prescription medicines for pain, discomfort, or fever as directed by your caregiver.  Use a warm mist humidifier or inhale steam from a shower to increase air moisture. This may keep secretions moist and make it easier to breathe.  Drink enough water and fluids to keep your urine clear or pale yellow.  Rest as needed.  Return to work when your temperature has returned to normal or as your caregiver advises. You may need to stay home  longer to avoid infecting others. You can also use a face mask and careful hand washing to prevent spread of the virus. SEEK MEDICAL CARE IF:   After the first few days, you feel you are getting worse rather than better.  You need your caregiver's advice about medicines to control symptoms.  You develop chills, worsening shortness of breath, or brown or red sputum. These may be signs of pneumonia.  You develop yellow or brown nasal discharge or pain in the face, especially when you bend forward. These may be signs of sinusitis.  You develop a fever, swollen neck glands, pain with swallowing, or white areas in the back of your throat. These may be signs of strep throat. SEEK IMMEDIATE MEDICAL CARE IF:   You have a fever.  You develop severe or persistent headache, ear pain, sinus pain, or chest pain.  You develop wheezing, a prolonged cough, cough up blood, or have a change in your usual mucus (if you have chronic lung disease).  You develop sore muscles or a stiff neck. Document Released: 10/01/2000 Document Revised: 06/30/2011 Document Reviewed: 08/09/2010 [facility] Patient Information 2015 [place], [place]. This information is not intended to replace advice given to you by your health care provider. Make sure you discuss any questions you have with your health care provider.

## 2013-10-31 NOTE — ED Provider Notes (Signed)
CSN: [id number]     Arrival date & time 10/31/13  1003 History   First MD Initiated Contact with Patient 10/31/13 1026     Chief Complaint  Patient presents with  . Insect Bite   (Consider location/radiation/quality/duration/timing/severity/associated sxs/prior Treatment) HPI Comments: Patient presents reporting that while she was hanging some streamers up along a deck railing 2 days ago, she was stung on both her hands by some hornets. Hands remain somewhat swollen, are itchy and tender. No fever/chils.  Also states that she has developed mild cough/cold sx over past 24 hours and that her "lungs hurt." Denies fever, chills, hemoptysis, dyspnea. Is a heavy smoker Works as Scientist, water quality  The history is provided by the patient.    Past Medical History  Diagnosis Date  . Complication of anesthesia     "woke up crying"  . GERD (gastroesophageal reflux disease)   . Depression     "about the way her voice sounds"   Past Surgical History  Procedure Laterality Date  . Abdominal hysterectomy    . Tonsillectomy      "grew back"  . Bunionectomy Right   . Microlaryngoscopy with laser N/A 06/03/2013    Procedure: MICROLARYNGOSCOPY WITH  EXCISION OF VOCAL CORD EDEMA;  Surgeon: Jerrell Belfast, MD;  Location: [facility];  Service: ENT;  Laterality: N/A;   No family history on file. History  Substance Use Topics  . Smoking status: Heavy Tobacco Smoker -- 1.00 packs/day for 35 years    Types: Cigarettes  . Smokeless tobacco: Not on file  . Alcohol Use: No   OB History   Grav Para Term Preterm Abortions TAB SAB Ect Mult Living                 Review of Systems  All other systems reviewed and are negative.   Allergies  Review of patient's allergies indicates no known allergies.  Home Medications   Prior to Admission medications   Medication Sig Start Date End Date Taking? Authorizing Provider  B Complex-C (B-COMPLEX WITH VITAMIN C) tablet Take 1 tablet by mouth daily.    Historical  Provider, MD  hydrOXYzine (ATARAX/VISTARIL) 25 MG tablet Take 1 tablet (25 mg total) by mouth every 8 (eight) hours as needed for itching. 10/31/13   Lahoma Rocker, PA  Multiple Vitamin (MULTIVITAMIN WITH MINERALS) TABS tablet Take 1 tablet by mouth daily.    Historical Provider, MD  Omega-3 Fatty Acids (FISH OIL) 1000 MG CAPS Take 1,000 mg by mouth daily.    Historical Provider, MD   BP 113/72  Pulse 95  Temp(Src) 98.4 F (36.9 C) (Oral)  Resp 18  SpO2 100% Physical Exam  Nursing note and vitals reviewed. Constitutional: She is oriented to person, place, and time. She appears well-developed and well-nourished. No distress.  HENT:  Head: Normocephalic and atraumatic.  Eyes: Conjunctivae are normal. No scleral icterus.  Cardiovascular: Normal rate, regular rhythm and normal heart sounds.   Pulmonary/Chest: Effort normal and breath sounds normal. No respiratory distress. She has no wheezes.  Musculoskeletal: Normal range of motion.  Neurological: She is alert and oriented to person, place, and time.  Skin: Skin is warm and dry.  Mild STS of bilateral hands with minimal erythema. No induration or areas of fluctuance. Capillary refill, sensation, and ROM intact. No evidence of compartment syndrome.   Psychiatric: She has a normal mood and affect. Her behavior is normal.    ED Course  Procedures (including critical care time) Labs Review Labs  Reviewed - No data to display  Imaging Review Dg Chest 2 View  10/31/2013   CLINICAL DATA:  Shortness of breath this morning. The patient was stung by several hornets yesterday.  EXAM: CHEST  2 VIEW  COMPARISON:  None.  FINDINGS: Heart size and pulmonary vascularity are normal and the lungs are clear. No osseous abnormality. No effusions.  IMPRESSION: Normal exam.   Electronically Signed   By: Rozetta Nunnery M.D.   On: 10/31/2013 11:19     MDM   1. URI (upper respiratory infection)   2. Wasp sting, accidental or unintentional, initial  encounter   CXR unremarkable. Continue ice, ibuprofen and elevation of hands to reduce discomfort and swelling. Will add Atarax for itching and advise follow up if no improvement.    [place], [place] 10/31/13 1341

## 2013-10-31 NOTE — ED Notes (Signed)
Pt reports she was stung by "hornets" x6-7 on hands/arms on Saturday while decorating Reports swelling of hands and woke up this morning w/dyspnea Taking OTC antihistamine and applying ice w/no relief Alert and talking in complete sentences w/no signs of acute distress.

## 2013-11-01 NOTE — ED Provider Notes (Signed)
Medical screening examination/treatment/procedure(s) were performed by non-physician practitioner and as supervising physician I was immediately available for consultation/collaboration.  Philipp Deputy, M.D.  Harden Mo, MD 11/01/13 [phone]

## 2014-05-04 ENCOUNTER — Encounter (HOSPITAL_COMMUNITY): Payer: Self-pay | Admitting: Otolaryngology

## 2015-02-07 IMAGING — CR DG CHEST 2V
2 series · 2 of 2 positions shown · non-contrast
Comparison: None.

CLINICAL DATA: Shortness of breath this morning. The patient was
stung by several hornets yesterday.

EXAM:
CHEST  2 VIEW

[view not recorded (1 of 2)]
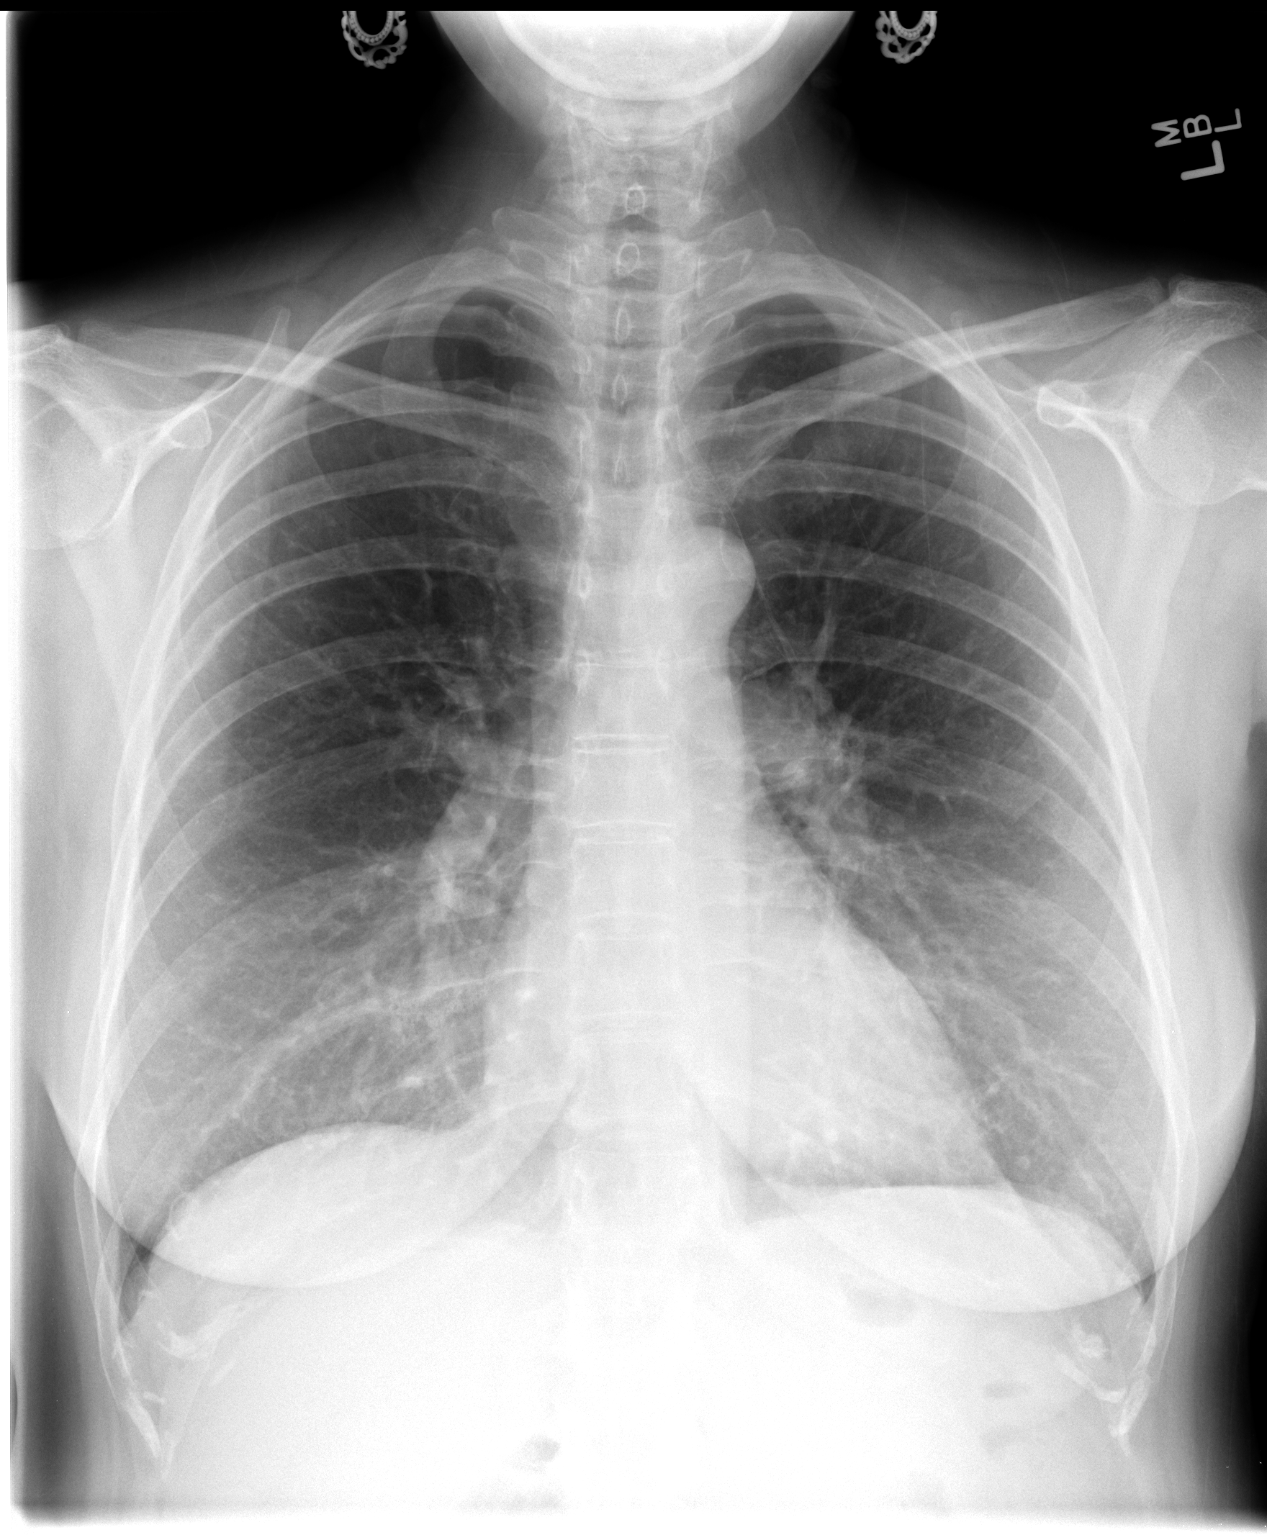

[view not recorded (2 of 2)]
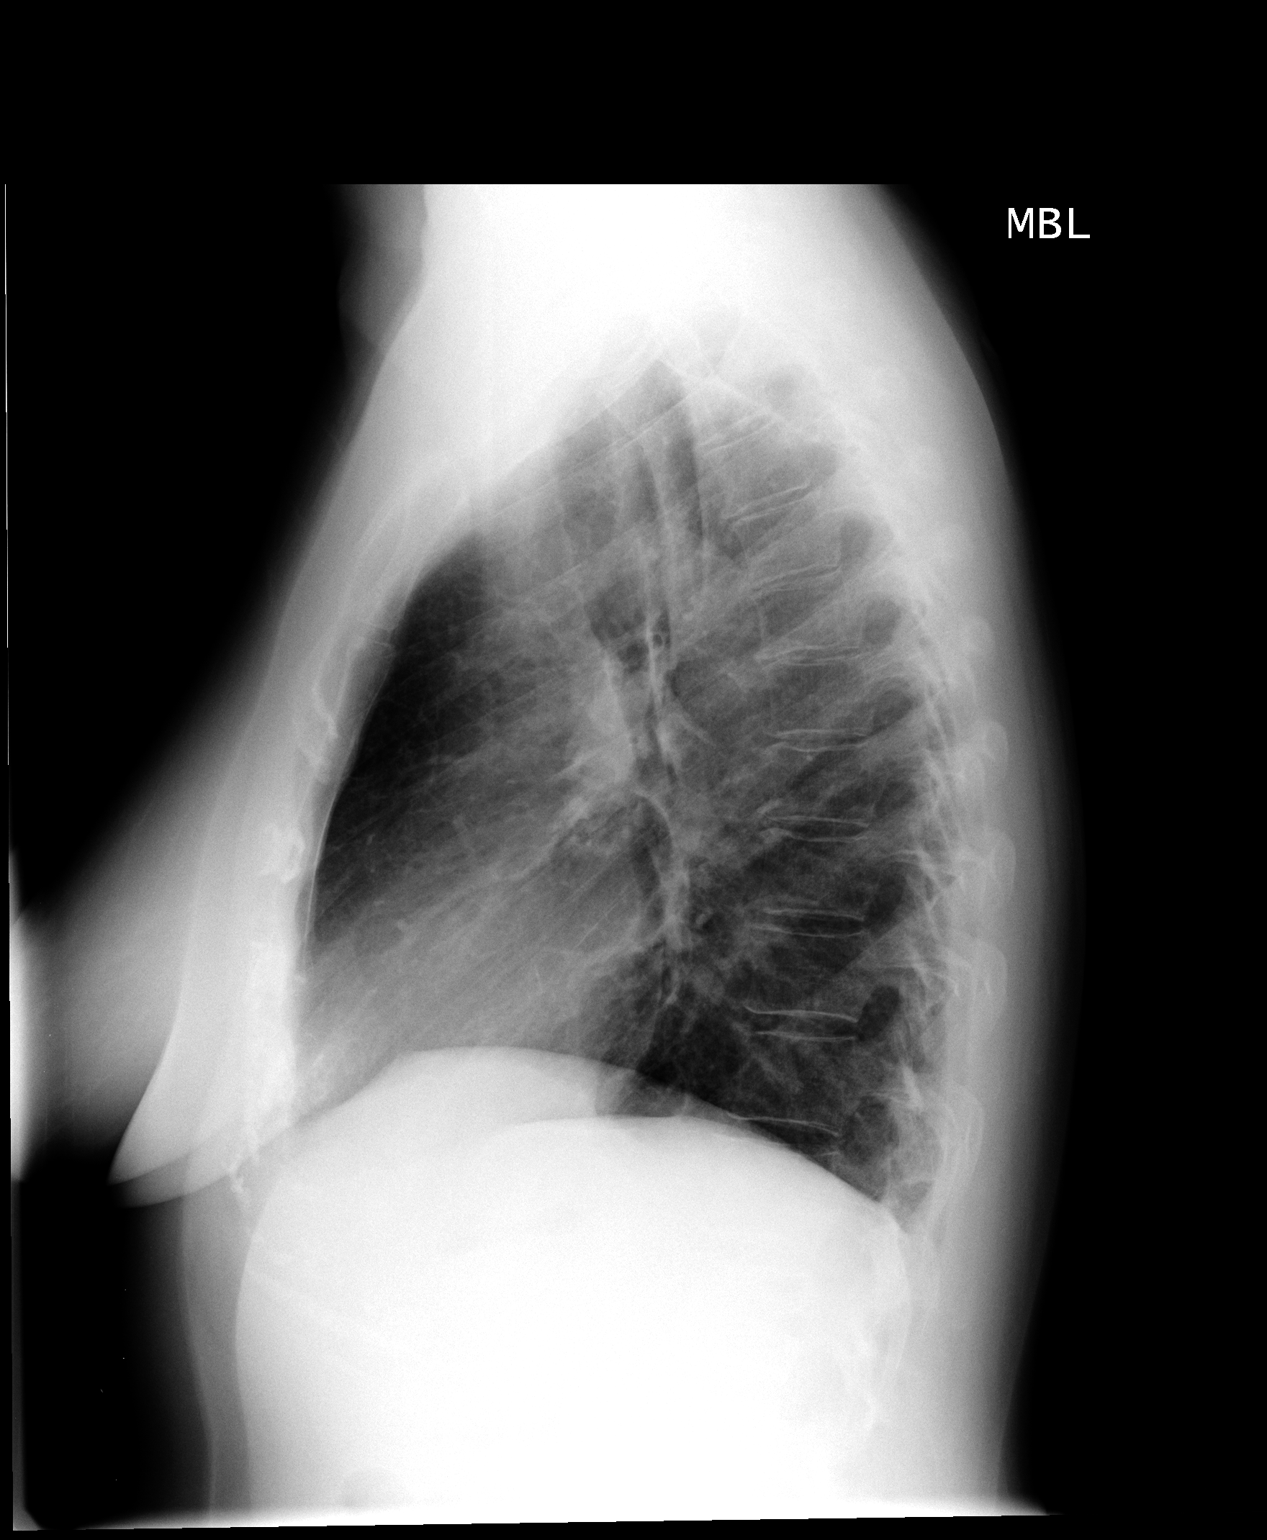

[2 of 2 positions shown; findings below may reference images not displayed]

FINDINGS: Heart size and pulmonary vascularity are normal and the lungs are
clear. No osseous abnormality. No effusions.
IMPRESSION: Normal exam.

## 2020-08-19 DEATH — deceased
